# Patient Record
Sex: Female | Born: 1975
Health system: Southern US, Community
[De-identification: ages and names within clinical notes are randomized; demographics above are authoritative.]

## PROBLEM LIST (undated history)

## (undated) DIAGNOSIS — F32A Depression, unspecified: Secondary | ICD-10-CM

## (undated) DIAGNOSIS — Z973 Presence of spectacles and contact lenses: Secondary | ICD-10-CM

## (undated) DIAGNOSIS — N939 Abnormal uterine and vaginal bleeding, unspecified: Secondary | ICD-10-CM

## (undated) DIAGNOSIS — D649 Anemia, unspecified: Secondary | ICD-10-CM

## (undated) DIAGNOSIS — M069 Rheumatoid arthritis, unspecified: Secondary | ICD-10-CM

## (undated) DIAGNOSIS — F329 Major depressive disorder, single episode, unspecified: Secondary | ICD-10-CM

## (undated) DIAGNOSIS — I10 Essential (primary) hypertension: Secondary | ICD-10-CM

## (undated) DIAGNOSIS — IMO0001 Reserved for inherently not codable concepts without codable children: Secondary | ICD-10-CM

## (undated) DIAGNOSIS — M26609 Unspecified temporomandibular joint disorder, unspecified side: Secondary | ICD-10-CM

## (undated) HISTORY — DX: Depression, unspecified: F32.A

## (undated) HISTORY — DX: Abnormal uterine and vaginal bleeding, unspecified: N93.9

## (undated) HISTORY — DX: Essential (primary) hypertension: I10

## (undated) HISTORY — PX: ABDOMINAL HYSTERECTOMY: SHX81

## (undated) HISTORY — DX: Major depressive disorder, single episode, unspecified: F32.9

## (undated) HISTORY — DX: Rheumatoid arthritis, unspecified: M06.9

---

## 2010-12-27 ENCOUNTER — Other Ambulatory Visit: Payer: Self-pay | Admitting: Rheumatology

## 2010-12-27 DIAGNOSIS — M064 Inflammatory polyarthropathy: Secondary | ICD-10-CM

## 2010-12-28 ENCOUNTER — Ambulatory Visit
Admission: RE | Admit: 2010-12-28 | Discharge: 2010-12-28 | Disposition: A | Discharge status: Home / Self Care | Payer: BC Managed Care – PPO | Source: Ambulatory Visit | Attending: Rheumatology | Admitting: Rheumatology

## 2010-12-28 DIAGNOSIS — M064 Inflammatory polyarthropathy: Secondary | ICD-10-CM

## 2010-12-28 MED ORDER — GADOBENATE DIMEGLUMINE 529 MG/ML IV SOLN
18.0000 mL | Freq: Once | INTRAVENOUS | Status: AC | PRN
  Administered 2010-12-28: 18 mL via INTRAVENOUS

## 2011-03-08 DIAGNOSIS — M069 Rheumatoid arthritis, unspecified: Secondary | ICD-10-CM

## 2011-03-08 HISTORY — DX: Rheumatoid arthritis, unspecified: M06.9

## 2013-01-22 ENCOUNTER — Other Ambulatory Visit (HOSPITAL_COMMUNITY)
Admission: RE | Admit: 2013-01-22 | Discharge: 2013-01-22 | Disposition: A | Discharge status: Home / Self Care | Payer: BC Managed Care – PPO | Source: Ambulatory Visit | Attending: Family Medicine | Admitting: Family Medicine

## 2013-01-22 DIAGNOSIS — Z1151 Encounter for screening for human papillomavirus (HPV): Secondary | ICD-10-CM | POA: Insufficient documentation

## 2013-01-22 DIAGNOSIS — Z124 Encounter for screening for malignant neoplasm of cervix: Secondary | ICD-10-CM | POA: Insufficient documentation

## 2013-04-15 ENCOUNTER — Other Ambulatory Visit: Payer: Self-pay | Admitting: Family Medicine

## 2013-04-15 DIAGNOSIS — N939 Abnormal uterine and vaginal bleeding, unspecified: Secondary | ICD-10-CM

## 2014-07-30 ENCOUNTER — Other Ambulatory Visit: Payer: Self-pay | Admitting: Family Medicine

## 2014-07-30 ENCOUNTER — Ambulatory Visit
Admission: RE | Admit: 2014-07-30 | Discharge: 2014-07-30 | Disposition: A | Discharge status: Home / Self Care | Payer: 59 | Source: Ambulatory Visit | Attending: Family Medicine | Admitting: Family Medicine

## 2014-07-30 DIAGNOSIS — M545 Low back pain: Secondary | ICD-10-CM

## 2014-08-06 HISTORY — PX: OTHER SURGICAL HISTORY: SHX169

## 2015-07-01 ENCOUNTER — Encounter: Payer: Self-pay | Admitting: Obstetrics and Gynecology

## 2015-07-01 ENCOUNTER — Ambulatory Visit (HOSPITAL_COMMUNITY)
Admission: RE | Admit: 2015-07-01 | Discharge: 2015-07-01 | Disposition: A | Discharge status: Home / Self Care | Payer: Commercial Managed Care - HMO | Source: Ambulatory Visit | Attending: Obstetrics and Gynecology | Admitting: Obstetrics and Gynecology

## 2015-07-01 ENCOUNTER — Ambulatory Visit (INDEPENDENT_AMBULATORY_CARE_PROVIDER_SITE_OTHER): Payer: Commercial Managed Care - HMO | Admitting: Obstetrics and Gynecology

## 2015-07-01 ENCOUNTER — Telehealth: Payer: Self-pay

## 2015-07-01 VITALS — BP 142/98 | HR 90 | Resp 20 | Ht 67.0 in | Wt 206.8 lb

## 2015-07-01 DIAGNOSIS — D62 Acute posthemorrhagic anemia: Secondary | ICD-10-CM

## 2015-07-01 DIAGNOSIS — N939 Abnormal uterine and vaginal bleeding, unspecified: Secondary | ICD-10-CM

## 2015-07-01 DIAGNOSIS — R42 Dizziness and giddiness: Secondary | ICD-10-CM

## 2015-07-01 DIAGNOSIS — N920 Excessive and frequent menstruation with regular cycle: Secondary | ICD-10-CM | POA: Diagnosis present

## 2015-07-01 DIAGNOSIS — N8312 Corpus luteum cyst of left ovary: Secondary | ICD-10-CM | POA: Insufficient documentation

## 2015-07-01 LAB — CBC
HCT: 29.4 % — ABNORMAL LOW (ref 35.0–45.0)
Hemoglobin: 9.4 g/dL — ABNORMAL LOW (ref 11.7–15.5)
MCH: 27.7 pg (ref 27.0–33.0)
MCHC: 32 g/dL (ref 32.0–36.0)
MCV: 86.7 fL (ref 80.0–100.0)
MPV: 8.8 fL (ref 7.5–12.5)
Platelets: 398 10*3/uL (ref 140–400)
RBC: 3.39 MIL/uL — ABNORMAL LOW (ref 3.80–5.10)
RDW: 14.6 % (ref 11.0–15.0)
WBC: 5.1 10*3/uL (ref 3.8–10.8)

## 2015-07-01 LAB — POCT URINE PREGNANCY: Preg Test, Ur: NEGATIVE

## 2015-07-01 MED ORDER — MEDROXYPROGESTERONE ACETATE 10 MG PO TABS: 10.0000 mg | ORAL_TABLET | Freq: Every day | ORAL | Status: DC

## 2015-07-01 NOTE — Telephone Encounter (Signed)
Phone call to patient regarding her bleeding and pelvic ultrasound report.   Patient states her mother had uterine and ovarian cancer.  Patient questions this diagnosis.  She started the iron and the Provera.  Pelvic ultrasound - thickened endometrium of 20 mm. No obvious blood vessel.   No myometrial masses. Ovaries normal.  Left CL cyst.   I am recommending proceeding forward with a hysteroscopy with dilation and curettage.  Risks, benefits, and alternatives reviewed. Risks include but are not limited to bleeding, infection, damage to surrounding organs including uterine perforation requiring hospitalization and laparoscopy, pulmonary edema, reaction to anesthesia, DVT, PE, death, need for further treatment and surgery including hysterectomy or medical therapy.   Surgical expectations and recovery discussed.  Patient wishes to proceed. Will proceed with surgery on 07/03/15 at [facility].   Questions invited and answered.  Patient will not need to return for a preop visit tomorrow.

## 2015-07-01 NOTE — Telephone Encounter (Signed)
Ultrasound report review with Dr Quincy Simmonds. Call to patient. Advised Dr Quincy Simmonds has reviewed report and will be calling her personally.  Asked me to provide brief update until she is able to call. Advised ultrasound reveals thickened endometrial lining and sampling of lining is recommended. Dr Quincy Simmonds feels that best way to do this, particularly with amount of bleeding she is having, would be to proceed with D&C. Would like to proceed on Friday pending scheduling with hospital. Dr Quincy Simmonds will call her later this evening for further discussion. Patient agreeable.

## 2015-07-01 NOTE — Progress Notes (Signed)
Patient ID: Nicole Figueroa, female   DOB: 12-06-1975, 40 y.o.   MRN: [id number] GYNECOLOGY  VISIT   HPI: 40 y.o.   Married  Caucasian  female   [id number] with Patient's last menstrual period was 05/05/2015 (approximate).   here for irregular heavy menstrual bleeding since 05-05-15.  Clotting.  Passing 6 -7 per day.  Some lower back pain and headache.  Occasional tender moment in lower abdomen but usually no abdominal cramping.  Has low back pain with her cycles.  Feels out of breath.  Tired.  Works nights and has rheumatoid arthritis.   Menses prior to Feb. Were normal.  Normally lasting 3 -4 days and has only 2 heavy days.  Had a month long menses last year as well.   No prior ultrasound.   Referred by another patient.   Hgb 9.5.   UPT today:  neg  PCP - Dr. Kathyrn Lass  GYNECOLOGIC HISTORY: Patient's last menstrual period was 05/05/2015 (approximate). Contraception:  Vasectomy Menopausal hormone therapy:  n/a Last mammogram:  NEVER Last pap smear:   2 years ago normal per patient.  Had one repeat pap 10 - 12 years ago with repeat normal.         OB History    Gravida Para Term Preterm AB TAB SAB Ectopic Multiple Living   4 4 4       4          There are no active problems to display for this patient.   Past Medical History  Diagnosis Date  . Depression     when had a loss in family--fam hx of depression  . Abnormal uterine bleeding 2016, 06/2015  . Hypertension   . RA (rheumatoid arthritis) ([place]) 2013    --trying to control with diet--followed by PCP    Past Surgical History  Procedure Laterality Date  . Broken foot Right 08/2014    Current Outpatient Prescriptions  Medication Sig Dispense Refill  . FLUoxetine (PROZAC) 20 MG tablet Take 1 tablet by mouth daily.    . hydrochlorothiazide (HYDRODIURIL) 25 MG tablet Take 1 tablet by mouth daily.     No current facility-administered medications for this visit.     ALLERGIES: Review of patient's allergies  indicates no known allergies.  Family History  Problem Relation Age of Onset  . Heart disease Father     dec age 36  . Hypertension Mother   . Hypertension Maternal Grandmother   . Stroke Maternal Grandmother   . Cancer Maternal Aunt     dec brain ca--glioblastoma  . Cancer Maternal Grandfather     Dec Glioblastoma  . Cancer Other     Dec Glioblastoma    Social History   Social History  . Marital Status: Married    Spouse Name: N/A  . Number of Children: N/A  . Years of Education: N/A   Occupational History  . Not on file.   Social History Main Topics  . Smoking status: Former Smoker    Types: Cigarettes    Quit date: 03/07/2006  . Smokeless tobacco: Not on file  . Alcohol Use: 1.2 - 1.8 oz/week    2-3 Standard drinks or equivalent per week     Comment: 2-3 glasses of wine/week of occ. marguerita  . Drug Use: No  . Sexual Activity:    Partners: Male    Birth Control/ Protection: Other-see comments     Comment: husband with vasectomy   Other Topics Concern  . Not on file  Social History Narrative  . No narrative on file    ROS:  Pertinent items are noted in HPI.  PHYSICAL EXAMINATION:    BP 142/98 mmHg  Pulse 90  Resp 20  Ht 5' 7"  (1.702 m)  Wt 206 lb 12.8 oz (93.804 kg)  BMI 32.38 kg/m2  LMP 05/05/2015 (Approximate)    General appearance: alert, cooperative and appears stated age Head: Normocephalic, without obvious abnormality, atraumatic Neck: no adenopathy, supple, symmetrical, trachea midline and thyroid normal to inspection and palpation Lungs: clear to auscultation bilaterally Heart: regular rate and rhythm Abdomen: incision(s):No.,  soft, non-tender, no masses,  no organomegaly Extremities: extremities normal, atraumatic, no cyanosis or edema Skin: Skin color, texture, turgor normal. No rashes or lesions Lymph nodes: Cervical, supraclavicular, and axillary nodes normal. No abnormal inguinal nodes palpated Neurologic: Grossly  normal  Pelvic: External genitalia:  no lesions              Urethra:  normal appearing urethra with no masses, tenderness or lesions              Bartholins and Skenes: normal                 Vagina: normal appearing vagina with normal color and discharge, no lesions              Cervix: no lesions and clots in the vagina.           Bimanual Exam:  Uterus:  Slightly enlarged, nontender.               Adnexa: normal adnexa and no mass, fullness, tenderness          Chaperone was present for exam.  ASSESSMENT  Menometrorrhagia. 2 months duration.  Anemia.  Dizziness. HTN.  PLAN  Etiologies of abnormal bleeding discussed.  CBC, iron, ferritin, TSH, CMP. Proceed with pelvic ultrasound at [facility] today.  They will call report to our office. Rx for Provera 10 mg po q d for 10 days.  Start today.  Iron sulfate 325 mg po bid.  May need dilation and curettage. This discussed with patient.    An After Visit Summary was printed and given to the patient.  ___30___ minutes face to face time of which over 50% was spent in counseling.

## 2015-07-01 NOTE — Telephone Encounter (Signed)
Patient had u/s done today wanting to know what her next steps are? Best # to reach: [phone]

## 2015-07-01 NOTE — Patient Instructions (Signed)
Please take iron sulfate 325 mg by mouth twice a day for anemia.

## 2015-07-01 NOTE — Telephone Encounter (Signed)
Spoke with Caryl Pina from [facility] at time of incoming call. Impression report from PUS given by Caryl Pina as seen below. PUS result report is available in EPIC for review. Copy has been placed on Dr.Silva's desk for review and advise as well.  IMPRESSION: Endometrial complex measures 20 mm.  If bleeding remains unresponsive to hormonal or medical therapy, focal lesion work-up with sonohysterogram should be considered. Endometrial biopsy should also be considered in pre-menopausal patients at high risk for endometrial carcinoma.

## 2015-07-01 NOTE — Telephone Encounter (Signed)
Patient is asking to talk with Nicole Figueroa about the ultrasound that she had today.

## 2015-07-01 NOTE — Progress Notes (Signed)
Pelvic ultrasound scheduled for today at 3pm at [facility].

## 2015-07-02 ENCOUNTER — Encounter (HOSPITAL_BASED_OUTPATIENT_CLINIC_OR_DEPARTMENT_OTHER): Payer: Self-pay | Admitting: *Deleted

## 2015-07-02 ENCOUNTER — Encounter: Payer: Self-pay | Admitting: Obstetrics and Gynecology

## 2015-07-02 LAB — COMPREHENSIVE METABOLIC PANEL
ALT: 11 U/L (ref 6–29)
AST: 11 U/L (ref 10–30)
Albumin: 3.8 g/dL (ref 3.6–5.1)
Alkaline Phosphatase: 66 U/L (ref 33–115)
BUN: 7 mg/dL (ref 7–25)
CO2: 21 mmol/L (ref 20–31)
Calcium: 8.6 mg/dL (ref 8.6–10.2)
Chloride: 107 mmol/L (ref 98–110)
Creat: 0.79 mg/dL (ref 0.50–1.10)
Glucose, Bld: 100 mg/dL — ABNORMAL HIGH (ref 65–99)
Potassium: 4 mmol/L (ref 3.5–5.3)
Sodium: 141 mmol/L (ref 135–146)
Total Bilirubin: 0.3 mg/dL (ref 0.2–1.2)
Total Protein: 6.7 g/dL (ref 6.1–8.1)

## 2015-07-02 LAB — FERRITIN: Ferritin: 4 ng/mL — ABNORMAL LOW (ref 10–232)

## 2015-07-02 LAB — HEMOGLOBIN A1C
Hgb A1c MFr Bld: 5.2 % (ref <5.7)
Mean Plasma Glucose: 103 mg/dL

## 2015-07-02 LAB — TSH: TSH: 1.99 mIU/L

## 2015-07-02 LAB — IRON: Iron: 12 ug/dL — ABNORMAL LOW (ref 40–190)

## 2015-07-02 NOTE — Telephone Encounter (Signed)
I would recommend waiting until Monday for work due to the intensity and demands of her work at [employer].

## 2015-07-02 NOTE — Telephone Encounter (Signed)
Call to patient. Notified of Dr Elza Rafter recommendation. Encounter closed.

## 2015-07-02 NOTE — Anesthesia Preprocedure Evaluation (Addendum)
Anesthesia Evaluation  Patient identified by MRN, date of birth, ID band Patient awake    Reviewed: Allergy & Precautions, NPO status , Patient's Chart, lab work & pertinent test results  History of Anesthesia Complications Negative for: history of anesthetic complications  Airway Mallampati: II  TM Distance: >3 FB Neck ROM: Full    Dental no notable dental hx. (+) Dental Advisory Given, Teeth Intact   Pulmonary neg pulmonary ROS, former smoker,    Pulmonary exam normal breath sounds clear to auscultation       Cardiovascular hypertension, Pt. on medications Normal cardiovascular exam Rhythm:Regular Rate:Normal     Neuro/Psych PSYCHIATRIC DISORDERS Anxiety Depression negative neurological ROS     GI/Hepatic negative GI ROS, Neg liver ROS,   Endo/Other  obesity  Renal/GU negative Renal ROS  negative genitourinary   Musculoskeletal  (+) Arthritis ,   Abdominal   Peds negative pediatric ROS (+)  Hematology negative hematology ROS (+) anemia ,   Anesthesia Other Findings   Reproductive/Obstetrics negative OB ROS                         Anesthesia Physical Anesthesia Plan  ASA: II  Anesthesia Plan: General   Post-op Pain Management:    Induction: Intravenous  Airway Management Planned: LMA  Additional Equipment:   Intra-op Plan:   Post-operative Plan: Extubation in OR  Informed Consent: I have reviewed the patients History and Physical, chart, labs and discussed the procedure including the risks, benefits and alternatives for the proposed anesthesia with the patient or authorized representative who has indicated his/her understanding and acceptance.   Dental advisory given  Plan Discussed with: CRNA  Anesthesia Plan Comments:         Anesthesia Quick Evaluation

## 2015-07-02 NOTE — Progress Notes (Signed)
Pt instructed npo pmn tonight.  To [facility] 4/28 @ 0600.  Labs in epic.

## 2015-07-02 NOTE — Telephone Encounter (Signed)
Surgery scheduled for Friday 07-03-15 at [facility] at 0730. Call to patient, advised of surgery date and time. Surgery instructions reviewed with patient and directions given to facility.  Advised surgery center will contact her as well. Plan to arrive at 0600.  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  Advised will review with Dr Quincy Simmonds for recommendation on returning on Sunday night.

## 2015-07-03 ENCOUNTER — Encounter (HOSPITAL_BASED_OUTPATIENT_CLINIC_OR_DEPARTMENT_OTHER)
Admission: RE | Disposition: A | Discharge status: Home / Self Care | Payer: Self-pay | Source: Ambulatory Visit | Attending: Obstetrics and Gynecology

## 2015-07-03 ENCOUNTER — Ambulatory Visit (HOSPITAL_BASED_OUTPATIENT_CLINIC_OR_DEPARTMENT_OTHER): Payer: Commercial Managed Care - HMO | Admitting: Anesthesiology

## 2015-07-03 ENCOUNTER — Ambulatory Visit (HOSPITAL_COMMUNITY)
Admission: RE | Admit: 2015-07-03 | Discharge: 2015-07-03 | Disposition: A | Discharge status: Home / Self Care | Payer: Commercial Managed Care - HMO | Source: Ambulatory Visit | Attending: Obstetrics and Gynecology | Admitting: Obstetrics and Gynecology

## 2015-07-03 ENCOUNTER — Encounter (HOSPITAL_BASED_OUTPATIENT_CLINIC_OR_DEPARTMENT_OTHER): Payer: Self-pay | Admitting: Anesthesiology

## 2015-07-03 DIAGNOSIS — M545 Low back pain: Secondary | ICD-10-CM | POA: Insufficient documentation

## 2015-07-03 DIAGNOSIS — D649 Anemia, unspecified: Secondary | ICD-10-CM | POA: Diagnosis not present

## 2015-07-03 DIAGNOSIS — I10 Essential (primary) hypertension: Secondary | ICD-10-CM | POA: Diagnosis not present

## 2015-07-03 DIAGNOSIS — N840 Polyp of corpus uteri: Secondary | ICD-10-CM | POA: Diagnosis not present

## 2015-07-03 DIAGNOSIS — R51 Headache: Secondary | ICD-10-CM | POA: Insufficient documentation

## 2015-07-03 DIAGNOSIS — N8500 Endometrial hyperplasia, unspecified: Secondary | ICD-10-CM | POA: Diagnosis not present

## 2015-07-03 DIAGNOSIS — M069 Rheumatoid arthritis, unspecified: Secondary | ICD-10-CM | POA: Diagnosis not present

## 2015-07-03 DIAGNOSIS — N921 Excessive and frequent menstruation with irregular cycle: Secondary | ICD-10-CM | POA: Insufficient documentation

## 2015-07-03 DIAGNOSIS — F329 Major depressive disorder, single episode, unspecified: Secondary | ICD-10-CM | POA: Insufficient documentation

## 2015-07-03 DIAGNOSIS — F419 Anxiety disorder, unspecified: Secondary | ICD-10-CM | POA: Diagnosis not present

## 2015-07-03 DIAGNOSIS — N399 Disorder of urinary system, unspecified: Secondary | ICD-10-CM | POA: Diagnosis not present

## 2015-07-03 DIAGNOSIS — Z87891 Personal history of nicotine dependence: Secondary | ICD-10-CM | POA: Insufficient documentation

## 2015-07-03 HISTORY — DX: Unspecified temporomandibular joint disorder, unspecified side: M26.609

## 2015-07-03 HISTORY — PX: HYSTEROSCOPY WITH D & C: SHX1775

## 2015-07-03 HISTORY — DX: Anemia, unspecified: D64.9

## 2015-07-03 HISTORY — DX: Presence of spectacles and contact lenses: Z97.3

## 2015-07-03 HISTORY — DX: Reserved for inherently not codable concepts without codable children: IMO0001

## 2015-07-03 SURGERY — DILATATION AND CURETTAGE /HYSTEROSCOPY
Anesthesia: General

## 2015-07-03 MED ORDER — PHENYLEPHRINE HCL 10 MG/ML IJ SOLN
INTRAMUSCULAR | Status: DC | PRN
  Administered 2015-07-03 (×2): 80 ug via INTRAVENOUS
  Administered 2015-07-03: 40 ug via INTRAVENOUS

## 2015-07-03 MED ORDER — LIDOCAINE HCL (CARDIAC) 20 MG/ML IV SOLN
INTRAVENOUS | Status: DC | PRN
  Administered 2015-07-03: 100 mg via INTRAVENOUS

## 2015-07-03 MED ORDER — CEFAZOLIN SODIUM-DEXTROSE 2-4 GM/100ML-% IV SOLN
2.0000 g | INTRAVENOUS | Status: DC
  Filled 2015-07-03: qty 100

## 2015-07-03 MED ORDER — PROPOFOL 10 MG/ML IV BOLUS
INTRAVENOUS | Status: AC
  Filled 2015-07-03: qty 40

## 2015-07-03 MED ORDER — LABETALOL HCL 5 MG/ML IV SOLN
INTRAVENOUS | Status: AC
  Filled 2015-07-03: qty 4

## 2015-07-03 MED ORDER — DEXAMETHASONE SODIUM PHOSPHATE 10 MG/ML IJ SOLN
INTRAMUSCULAR | Status: AC
  Filled 2015-07-03: qty 1

## 2015-07-03 MED ORDER — CEFAZOLIN SODIUM-DEXTROSE 2-4 GM/100ML-% IV SOLN
INTRAVENOUS | Status: AC
  Filled 2015-07-03: qty 100

## 2015-07-03 MED ORDER — PROPOFOL 10 MG/ML IV BOLUS
INTRAVENOUS | Status: DC | PRN
  Administered 2015-07-03: 200 mg via INTRAVENOUS

## 2015-07-03 MED ORDER — FENTANYL CITRATE (PF) 100 MCG/2ML IJ SOLN
INTRAMUSCULAR | Status: AC
  Filled 2015-07-03: qty 2

## 2015-07-03 MED ORDER — ONDANSETRON HCL 4 MG/2ML IJ SOLN
INTRAMUSCULAR | Status: AC
  Filled 2015-07-03: qty 2

## 2015-07-03 MED ORDER — PHENYLEPHRINE 40 MCG/ML (10ML) SYRINGE FOR IV PUSH (FOR BLOOD PRESSURE SUPPORT)
PREFILLED_SYRINGE | INTRAVENOUS | Status: AC
  Filled 2015-07-03: qty 10

## 2015-07-03 MED ORDER — KETOROLAC TROMETHAMINE 30 MG/ML IJ SOLN
INTRAMUSCULAR | Status: DC | PRN
  Administered 2015-07-03: 30 mg via INTRAVENOUS

## 2015-07-03 MED ORDER — ONDANSETRON HCL 4 MG/2ML IJ SOLN
4.0000 mg | Freq: Once | INTRAMUSCULAR | Status: DC | PRN
  Filled 2015-07-03: qty 2

## 2015-07-03 MED ORDER — LACTATED RINGERS IV SOLN
INTRAVENOUS | Status: DC
  Administered 2015-07-03 (×2): via INTRAVENOUS
  Filled 2015-07-03: qty 1000

## 2015-07-03 MED ORDER — FENTANYL CITRATE (PF) 100 MCG/2ML IJ SOLN
INTRAMUSCULAR | Status: DC | PRN
  Administered 2015-07-03 (×2): 50 ug via INTRAVENOUS

## 2015-07-03 MED ORDER — MIDAZOLAM HCL 5 MG/5ML IJ SOLN
INTRAMUSCULAR | Status: DC | PRN
  Administered 2015-07-03: 2 mg via INTRAVENOUS

## 2015-07-03 MED ORDER — FENTANYL CITRATE (PF) 100 MCG/2ML IJ SOLN
25.0000 ug | INTRAMUSCULAR | Status: DC | PRN
  Filled 2015-07-03: qty 1

## 2015-07-03 MED ORDER — DEXAMETHASONE SODIUM PHOSPHATE 4 MG/ML IJ SOLN
INTRAMUSCULAR | Status: DC | PRN
  Administered 2015-07-03: 10 mg via INTRAVENOUS

## 2015-07-03 MED ORDER — ONDANSETRON HCL 4 MG/2ML IJ SOLN
INTRAMUSCULAR | Status: DC | PRN
  Administered 2015-07-03: 4 mg via INTRAVENOUS

## 2015-07-03 MED ORDER — IBUPROFEN 800 MG PO TABS: 800.0000 mg | ORAL_TABLET | Freq: Three times a day (TID) | ORAL | Status: DC | PRN

## 2015-07-03 MED ORDER — LACTATED RINGERS IV SOLN
INTRAVENOUS | Status: DC
  Filled 2015-07-03: qty 1000

## 2015-07-03 MED ORDER — KETOROLAC TROMETHAMINE 30 MG/ML IJ SOLN
INTRAMUSCULAR | Status: AC
  Filled 2015-07-03: qty 1

## 2015-07-03 MED ORDER — LABETALOL HCL 5 MG/ML IV SOLN
5.0000 mg | INTRAVENOUS | Status: DC | PRN
  Administered 2015-07-03: 5 mg via INTRAVENOUS
  Filled 2015-07-03: qty 4

## 2015-07-03 MED ORDER — MIDAZOLAM HCL 2 MG/2ML IJ SOLN
INTRAMUSCULAR | Status: AC
  Filled 2015-07-03: qty 2

## 2015-07-03 MED ORDER — LIDOCAINE HCL 1 % IJ SOLN
INTRAMUSCULAR | Status: DC | PRN
  Administered 2015-07-03: 10 mL

## 2015-07-03 SURGICAL SUPPLY — 24 items
CANISTER SUCTION 2500CC (MISCELLANEOUS) ×2 IMPLANT
CATH ROBINSON RED A/P 16FR (CATHETERS) ×2 IMPLANT
COVER BACK TABLE 60X90IN (DRAPES) ×2 IMPLANT
COVER MAYO STAND STRL (DRAPES) ×2 IMPLANT
DRAPE HYSTEROSCOPY (DRAPE) ×2 IMPLANT
DRAPE LG THREE QUARTER DISP (DRAPES) ×2 IMPLANT
GLOVE BIO SURGEON STRL SZ 6.5 (GLOVE) ×2 IMPLANT
GLOVE BIOGEL PI IND STRL 7.0 (GLOVE) ×2 IMPLANT
GLOVE BIOGEL PI INDICATOR 7.0 (GLOVE) ×2
GOWN STRL REUS W/ TWL LRG LVL3 (GOWN DISPOSABLE) ×1 IMPLANT
GOWN STRL REUS W/ TWL XL LVL3 (GOWN DISPOSABLE) ×1 IMPLANT
GOWN STRL REUS W/TWL LRG LVL3 (GOWN DISPOSABLE) ×1
GOWN STRL REUS W/TWL XL LVL3 (GOWN DISPOSABLE) ×1
KIT ROOM TURNOVER WOR (KITS) ×2 IMPLANT
LEGGING LITHOTOMY PAIR STRL (DRAPES) ×2 IMPLANT
LOOP ANGLED CUTTING 22FR (CUTTING LOOP) IMPLANT
PACK BASIN DAY SURGERY FS (CUSTOM PROCEDURE TRAY) ×2 IMPLANT
PAD OB MATERNITY 4.3X12.25 (PERSONAL CARE ITEMS) ×2 IMPLANT
PAD PREP 24X48 CUFFED NSTRL (MISCELLANEOUS) ×2 IMPLANT
TOWEL OR 17X24 6PK STRL BLUE (TOWEL DISPOSABLE) ×4 IMPLANT
TRAY DSU PREP LF (CUSTOM PROCEDURE TRAY) ×2 IMPLANT
TUBING AQUILEX INFLOW (TUBING) ×2 IMPLANT
TUBING AQUILEX OUTFLOW (TUBING) ×2 IMPLANT
WATER STERILE IRR 500ML POUR (IV SOLUTION) ×2 IMPLANT

## 2015-07-03 NOTE — Anesthesia Procedure Notes (Signed)
Procedure Name: LMA Insertion Date/Time: 07/03/2015 7:28 AM Performed by: Wanita Chamberlain Pre-anesthesia Checklist: Patient identified, Timeout performed, Emergency Drugs available, Suction available and Patient being monitored Patient Re-evaluated:Patient Re-evaluated prior to inductionOxygen Delivery Method: Circle system utilized Preoxygenation: Pre-oxygenation with 100% oxygen Intubation Type: IV induction Ventilation: Mask ventilation without difficulty LMA: LMA inserted LMA Size: 4.0 Number of attempts: 1 Placement Confirmation: positive ETCO2 and breath sounds checked- equal and bilateral Tube secured with: Tape Dental Injury: Teeth and Oropharynx as per pre-operative assessment

## 2015-07-03 NOTE — Brief Op Note (Signed)
07/03/2015  7:51 AM  PATIENT:  Nicole Figueroa  40 y.o. female  PRE-OPERATIVE DIAGNOSIS:  AUB, thickened endometrium, anemia  POST-OPERATIVE DIAGNOSIS:  AUB, thickened endometrium, anemia  PROCEDURE:  Procedure(s): DILATATION AND CURETTAGE /HYSTEROSCOPY (N/A)  SURGEON:  Surgeon(s) and Role:    * Brook E Yisroel Ramming, MD - Primary  PHYSICIAN ASSISTANT: NA  ASSISTANTS: none   ANESTHESIA:   paracervical block and LMA  EBL:  Total I/O In: 600 [I.V.:600] Out: 40 [Urine:25; Blood:15]  BLOOD ADMINISTERED:none  DRAINS: none   LOCAL MEDICATIONS USED:  LIDOCAINE   SPECIMEN:  Source of Specimen:  endometrial curettings  DISPOSITION OF SPECIMEN:  PATHOLOGY  COUNTS:  YES  TOURNIQUET:  * No tourniquets in log *  DICTATION: .Note written in EPIC  PLAN OF CARE: Discharge to home after PACU  PATIENT DISPOSITION:  PACU - hemodynamically stable.   Delay start of Pharmacological VTE agent (>24hrs) due to surgical blood loss or risk of bleeding: not applicable

## 2015-07-03 NOTE — Anesthesia Postprocedure Evaluation (Signed)
Anesthesia Post Note  Patient: Nicole Figueroa  Procedure(s) Performed: Procedure(s) (LRB): DILATATION AND CURETTAGE /HYSTEROSCOPY (N/A)  Patient location during evaluation: PACU Anesthesia Type: General Level of consciousness: awake and alert Pain management: pain level controlled Vital Signs Assessment: post-procedure vital signs reviewed and stable Respiratory status: spontaneous breathing, nonlabored ventilation, respiratory function stable and patient connected to nasal cannula oxygen Cardiovascular status: blood pressure returned to baseline and stable Postop Assessment: no signs of nausea or vomiting Anesthetic complications: no    Last Vitals:  Filed Vitals:   07/03/15 0822 07/03/15 0830  BP:  135/95  Pulse: 73 87  Temp:    Resp: 11 18    Last Pain:  Filed Vitals:   07/03/15 0833  PainSc: 4                  JUDD, MARY JENNETTE

## 2015-07-03 NOTE — H&P (Signed)
Nunzio Cobbs, MD at 07/01/2015 9:27 AM     Status: Signed       Expand All Collapse All   Patient ID: Nicole Figueroa, female DOB: Nov 06, 1975, 40 y.o. MRN: [id number] GYNECOLOGY VISIT  HPI: 40 y.o. Married Caucasian female  [id number] with Patient's last menstrual period was 05/05/2015 (approximate).  here for irregular heavy menstrual bleeding since 05-05-15.  Clotting. Passing 6 -7 per day.  Some lower back pain and headache.  Occasional tender moment in lower abdomen but usually no abdominal cramping.  Has low back pain with her cycles.  Feels out of breath.  Tired. Works nights and has rheumatoid arthritis.   Menses prior to Feb. Were normal.  Normally lasting 3 -4 days and has only 2 heavy days.  Had a month long menses last year as well.   No prior ultrasound.   Referred by another patient.   Hgb 9.5.   UPT today: neg  PCP - Dr. Kathyrn Lass  GYNECOLOGIC HISTORY: Patient's last menstrual period was 05/05/2015 (approximate). Contraception: Vasectomy Menopausal hormone therapy: n/a Last mammogram: NEVER Last pap smear: 2 years ago normal per patient. Had one repeat pap 10 - 12 years ago with repeat normal.    OB History    Gravida Para Term Preterm AB TAB SAB Ectopic Multiple Living   4 4 4       4        There are no active problems to display for this patient.   Past Medical History  Diagnosis Date  . Depression     when had a loss in family--fam hx of depression  . Abnormal uterine bleeding 2016, 06/2015  . Hypertension   . RA (rheumatoid arthritis) ([place]) 2013    --trying to control with diet--followed by PCP    Past Surgical History  Procedure Laterality Date  . Broken foot Right 08/2014    Current Outpatient Prescriptions  Medication Sig Dispense Refill  . FLUoxetine (PROZAC) 20 MG tablet Take 1 tablet by mouth daily.    .  hydrochlorothiazide (HYDRODIURIL) 25 MG tablet Take 1 tablet by mouth daily.     No current facility-administered medications for this visit.     ALLERGIES: Review of patient's allergies indicates no known allergies.  Family History  Problem Relation Age of Onset  . Heart disease Father     dec age 22  . Hypertension Mother   . Hypertension Maternal Grandmother   . Stroke Maternal Grandmother   . Cancer Maternal Aunt     dec brain ca--glioblastoma  . Cancer Maternal Grandfather     Dec Glioblastoma  . Cancer Other     Dec Glioblastoma    Social History   Social History  . Marital Status: Married    Spouse Name: N/A  . Number of Children: N/A  . Years of Education: N/A   Occupational History  . Not on file.   Social History Main Topics  . Smoking status: Former Smoker    Types: Cigarettes    Quit date: 03/07/2006  . Smokeless tobacco: Not on file  . Alcohol Use: 1.2 - 1.8 oz/week    2-3 Standard drinks or equivalent per week     Comment: 2-3 glasses of wine/week of occ. marguerita  . Drug Use: No  . Sexual Activity:    Partners: Male    Birth Control/ Protection: Other-see comments     Comment: husband with vasectomy   Other Topics Concern  . Not on file  Social History Narrative  . No narrative on file    ROS: Pertinent items are noted in HPI.  PHYSICAL EXAMINATION:   BP 142/98 mmHg  Pulse 90  Resp 20  Ht 5' 7"  (1.702 m)  Wt 206 lb 12.8 oz (93.804 kg)  BMI 32.38 kg/m2  LMP 05/05/2015 (Approximate)  General appearance: alert, cooperative and appears stated age Head: Normocephalic, without obvious abnormality, atraumatic Neck: no adenopathy, supple, symmetrical, trachea midline and thyroid normal to inspection and palpation Lungs: clear to auscultation bilaterally Heart: regular rate and rhythm Abdomen:  incision(s):No., soft, non-tender, no masses, no organomegaly Extremities: extremities normal, atraumatic, no cyanosis or edema Skin: Skin color, texture, turgor normal. No rashes or lesions Lymph nodes: Cervical, supraclavicular, and axillary nodes normal. No abnormal inguinal nodes palpated Neurologic: Grossly normal  Pelvic: External genitalia: no lesions  Urethra: normal appearing urethra with no masses, tenderness or lesions  Bartholins and Skenes: normal   Vagina: normal appearing vagina with normal color and discharge, no lesions  Cervix: no lesions and clots in the vagina.    Bimanual Exam: Uterus: Slightly enlarged, nontender.   Adnexa: normal adnexa and no mass, fullness, tenderness   Chaperone was present for exam.  ASSESSMENT  Menometrorrhagia. 2 months duration.  Anemia.  Dizziness. HTN.  PLAN  Etiologies of abnormal bleeding discussed.  CBC, iron, ferritin, TSH, CMP. Proceed with pelvic ultrasound at [facility] today. They will call report to our office. Rx for Provera 10 mg po q d for 10 days. Start today.  Iron sulfate 325 mg po bid.  May need dilation and curettage. This discussed with patient.   An After Visit Summary was printed and given to the patient.  ___30___ minutes face to face time of which over 50% was spent in counseling.            Revision History       Date/Time User Action    > 07/02/2015 7:47 PM Nunzio Cobbs, MD Sign     07/01/2015 9:28 AM Lowella Fairy, CMA Sign at close encounter              Huey Romans, RN at 07/01/2015 10:23 AM     Status: Signed       Expand All Collapse All   Pelvic ultrasound scheduled for today at 3pm at [facility]

## 2015-07-03 NOTE — Op Note (Signed)
OPERATIVE REPORT   PREOPERATIVE DIAGNOSES:   Menorrhagia with irregular menses, thickened endometrium, anemia.  POSTOPERATIVE DIAGNOSES:   Menorrhagia with irregular menses, thickened endometrium, anemia.  PROCEDURE:  Hysteroscopy with dilation and curettage.  SURGEON:  Lenard Galloway, MD  ANESTHESIA:  LMA, paracervical block with 10 mL of 1% lidocaine.  IV FLUIDS:   700 cc LR  EBL:  15 cc.   URINE OUTPUT: 25 cc.  NORMAL SALINE DEFICIT:   75 cc.  COMPLICATIONS:  None.  INDICATIONS FOR THE PROCEDURE:   The patient is a 40 year old G67P4 Caucasian female who presents with prolonged and heavy vaginal bleeding and clotting. Pelvic ultrasound demonstrated an endometrial stripe of 2 cm, but an otherwise normal uterus and ovaries.  Her hemoglobin measured 9.4.  A plan is now made to proceed with a hysteroscopy with dilation and curettage after risks, benefits and alternatives were reviewed.  FINDINGS:  Exam under anesthesia revealed a small anteverted uterus.  No adnexal masses were appreciated. The uterus was sounded to 8.5 cm.  Hysteroscopy showed no intracavitary lesions.  The tubal ostia were normal.  Endometrial currettings were a large amount.   SPECIMENS:  Endometrial curettings were sent to Pathology.  PROCEDURE IN DETAIL:  The patient was reidentified in the preoperative hold area.  She did receive Ancef IV for antibiotic prophylaxis and she received PAS stockings for DVT prophylaxis.  In the operating room, the patient was placed in the dorsal lithotomy position and then an LMA anesthetic was introduced.  The patient's lower abdomen, vagina and perineum were sterilely prepped with Betadine and the  patient's bladder was catheterized of urine.  She was sterilly draped.  An exam under anesthesia was performed.  A speculum was placed inside the vagina and a single-tooth tenaculum was placed on the anterior cervical lip.  A paracervical block  was performed with a total of 10 mL of 1% lidocaine plain.  The uterus was sounded. The cervix was dilated to a #19 Pratt dilator.  The diagnostic hysteroscope was then inserted inside the uterine cavity under the continuous infusion of normal saline solution.  Findings are as noted above.  The  hysteroscope was removed.  The sharp and then a serrated curette were  introduced into the uterine cavity and the endometrium was curetted in all 4 quadrants.  A large amount of endometrial curettings was obtained.  This specimen was sent to Pathology.  The single-tooth tenaculum which had been placed on the anterior cervical lip was removed.  Hemostasis was good, and all of the vaginal  instruments were removed.  The patient was awakened and escorted to the recovery room in stable condition after she was cleansed with Betadine.  There were no complications to the procedure.  All needle, instrument and sponge counts were correct.  Lenard Galloway, MD

## 2015-07-03 NOTE — Discharge Instructions (Signed)
Hysteroscopy, Care After Refer to this sheet in the next few weeks. These instructions provide you with information on caring for yourself after your procedure. Your health care provider may also give you more specific instructions. Your treatment has been planned according to current medical practices, but problems sometimes occur. Call your health care provider if you have any problems or questions after your procedure.  WHAT TO EXPECT AFTER THE PROCEDURE After your procedure, it is typical to have the following:  You may have some cramping. This normally lasts for a couple days.  You may have bleeding. This can vary from light spotting for a few days to menstrual-like bleeding for 3-7 days. HOME CARE INSTRUCTIONS  Rest for the first 1-2 days after the procedure.  Only take over-the-counter or prescription medicines as directed by your health care provider. Do not take aspirin. It can increase the chances of bleeding.  Take showers instead of baths for 2 weeks or as directed by your health care provider.  Do not drive for 24 hours or as directed.  Do not drink alcohol while taking pain medicine.  Do not use tampons, douche, or have sexual intercourse for 2 weeks or until your health care provider says it is okay.  Take your temperature twice a day for 4-5 days. Write it down each time.  Follow your health care provider's advice about diet, exercise, and lifting.  If you develop constipation, you may:  Take a mild laxative if your health care provider approves.  Add bran foods to your diet.  Drink enough fluids to keep your urine clear or pale yellow.  Try to have someone with you or available to you for the first 24-48 hours, especially if you were given a general anesthetic.  Follow up with your health care provider as directed. SEEK MEDICAL CARE IF:  You feel dizzy or lightheaded.  You feel sick to your stomach (nauseous).  You have abnormal vaginal discharge.  You  have a rash.  You have pain that is not controlled with medicine. SEEK IMMEDIATE MEDICAL CARE IF:  You have bleeding that is heavier than a normal menstrual period.  You have a fever.  You have increasing cramps or pain, not controlled with medicine.  You have new belly (abdominal) pain.  You pass out.  You have pain in the tops of your shoulders (shoulder strap areas).  You have shortness of breath.   This information is not intended to replace advice given to you by your health care provider. Make sure you discuss any questions you have with your health care provider.   Document Released: 12/12/2012 Document Reviewed: 12/12/2012 Elsevier Interactive Patient Education 2016 Elsevier Inc[employer]  Dilation and Curettage or Vacuum Curettage, Care After Refer to this sheet in the next few weeks. These instructions provide you with information on caring for yourself after your procedure. Your health care provider may also give you more specific instructions. Your treatment has been planned according to current medical practices, but problems sometimes occur. Call your health care provider if you have any problems or questions after your procedure. WHAT TO EXPECT AFTER THE PROCEDURE After your procedure, it is typical to have light cramping and bleeding. This may last for 2 days to 2 weeks after the procedure. HOME CARE INSTRUCTIONS   Do not drive for 24 hours.  Wait 1 week before returning to strenuous activities.  Take your temperature 2 times a day for 4 days and write it down. Provide these temperatures to  your health care provider if you develop a fever.  Avoid long periods of standing.  Avoid heavy lifting, pushing, or pulling. Do not lift anything heavier than 10 pounds (4.5 kg).  Limit stair climbing to once or twice a day.  Take rest periods often.  You may resume your usual diet.  Drink enough fluids to keep your urine clear or pale yellow.  Your usual bowel function  should return. If you have constipation, you may:  Take a mild laxative with permission from your health care provider.  Add fruit and bran to your diet.  Drink more fluids.  Take showers instead of baths until your health care provider gives you permission to take baths.  Do not go swimming or use a hot tub until your health care provider approves.  Try to have someone with you or available to you the first 24-48 hours, especially if you were given a general anesthetic.  Do not douche, use tampons, or have sex (intercourse) for 2 weeks after the procedure.  Only take over-the-counter or prescription medicines as directed by your health care provider. Do not take aspirin. It can cause bleeding.  Follow up with your health care provider as directed. SEEK MEDICAL CARE IF:   You have increasing cramps or pain that is not relieved with medicine.  You have abdominal pain that does not seem to be related to the same area of earlier cramping and pain.  You have bad smelling vaginal discharge.  You have a rash.  You are having problems with any medicine. SEEK IMMEDIATE MEDICAL CARE IF:   You have bleeding that is heavier than a normal menstrual period.  You have a fever.  You have chest pain.  You have shortness of breath.  You feel dizzy or feel like fainting.  You pass out.  You have pain in your shoulder strap area.  You have heavy vaginal bleeding with or without blood clots. MAKE SURE YOU:   Understand these instructions.  Will watch your condition.  Will get help right away if you are not doing well or get worse.   This information is not intended to replace advice given to you by your health care provider. Make sure you discuss any questions you have with your health care provider.   Document Released: 02/19/2000 Document Revised: 02/26/2013 Document Reviewed: 09/20/2012 Elsevier Interactive Patient Education 2016 [facility]  Care Instructions  Activity: Get plenty of rest for the remainder of the day. A responsible adult should stay with you for 24 hours following the procedure.  For the next 24 hours, DO NOT: -Drive a car -Paediatric nurse -Drink alcoholic beverages -Take any medication unless instructed by your physician -Make any legal decisions or sign important papers.  Meals: Start with liquid foods such as gelatin or soup. Progress to regular foods as tolerated. Avoid greasy, spicy, heavy foods. If nausea and/or vomiting occur, drink only clear liquids until the nausea and/or vomiting subsides. Call your physician if vomiting continues.  Special Instructions/Symptoms: Your throat may feel dry or sore from the anesthesia or the breathing tube placed in your throat during surgery. If this causes discomfort, gargle with warm salt water. The discomfort should disappear within 24 hours.  If you had a scopolamine patch placed behind your ear for the management of post- operative nausea and/or vomiting:  1. The medication in the patch is effective for 72 hours, after which it should be removed.  Wrap patch in  a tissue and discard in the trash. Wash hands thoroughly with soap and water. 2. You may remove the patch earlier than 72 hours if you experience unpleasant side effects which may include dry mouth, dizziness or visual disturbances. 3. Avoid touching the patch. Wash your hands with soap and water after contact with the patch.

## 2015-07-03 NOTE — Transfer of Care (Signed)
Immediate Anesthesia Transfer of Care Note  Patient: Nicole Figueroa  Procedure(s) Performed: Procedure(s): DILATATION AND CURETTAGE /HYSTEROSCOPY (N/A)  Patient Location: PACU  Anesthesia Type:General  Level of Consciousness: awake, alert , oriented and patient cooperative  Airway & Oxygen Therapy: Patient Spontanous Breathing and Patient connected to nasal cannula oxygen  Post-op Assessment: Report given to RN and Post -op Vital signs reviewed and stable  Post vital signs: Reviewed and stable  Last Vitals:  Filed Vitals:   07/03/15 0620  BP: 148/101  Pulse: 97  Temp: 37.3 C  Resp: 16    Last Pain:  Filed Vitals:   07/03/15 0626  PainSc: 3       Patients Stated Pain Goal: 5 (26/33/35 4562)  Complications: No apparent anesthesia complications

## 2015-07-03 NOTE — Progress Notes (Signed)
Update to History and Physical  Ultrasound showing endometrial thickening with EMS 20 mm.  Bleeding slowing some with Provera.   Patient examined.   Ok to proceed with hysteroscopy with dilation and curettage.  Risks, benefits, and alternatives reviewed with the patient who wishes to proceed.

## 2015-07-06 ENCOUNTER — Encounter (HOSPITAL_BASED_OUTPATIENT_CLINIC_OR_DEPARTMENT_OTHER): Payer: Self-pay | Admitting: Obstetrics and Gynecology

## 2015-07-06 ENCOUNTER — Encounter: Payer: Self-pay | Admitting: Obstetrics and Gynecology

## 2015-07-06 ENCOUNTER — Telehealth: Payer: Self-pay

## 2015-07-06 NOTE — Telephone Encounter (Signed)
Non-Urgent Medical Question  Message [id number]   From  Shaleena Crusoe   To  Nunzio Cobbs, MD   Sent  07/06/2015 4:02 PM     Hey Dr Quincy Simmonds, I am doing well and   heading back to work tonight. I am noticing that I am still very tired amd very short of breath. Is that normal? Is it because of the low iron? How long will it take to get my iron back to where it needs to be?   Thanks  Nicole Figueroa      Responsible Party    Pool - [facility] No one has taken responsibility for this message.     No actions have been taken on this message.     Routing to [place] for review and advise.

## 2015-07-06 NOTE — Telephone Encounter (Signed)
Please report to patient her surgical pathology showing endometrial polyp.  There was no macroscopic polyp, so this is a microscopic diagnosis only.  Please have patient see me this week for her postop appointment and blood work so I can recheck a CBC.

## 2015-07-06 NOTE — Telephone Encounter (Signed)
Telephone encounter created to discuss mychart message with Dr.Silva.

## 2015-07-07 NOTE — Telephone Encounter (Signed)
Spoke with patient's husband Gaspar Bidding, okay per ROI. Gaspar Bidding states he is calling to check on mychart message as the patient worked last night and was unsure if we would be able to reach her since she is sleeping. Advised of message as seen below from [place]. Gaspar Bidding states that his wife feels slightly short of breath due to fatigue. Denies dizziness, light headedness, or chest pain. Gaspar Bidding will check with the patient to ensure no new symptoms have developed. Appointment for recheck scheduled for tomorrow 07/08/2015 at 10 am. Advised if the patient develops any new symptoms such as increased SOB, chest pain, dizziness, or light headedness she will need to be seen in the office earlier. He is agreeable.  Routing to provider for final review. Patient agreeable to disposition. Will close encounter.

## 2015-07-07 NOTE — Telephone Encounter (Signed)
Patient's husband Gaspar Bidding (dpr on file) calling regarding message from yesterday. Says patient is still tired and wondering if she should come in. [phone]

## 2015-07-08 ENCOUNTER — Emergency Department (HOSPITAL_COMMUNITY)
Admission: EM | Admit: 2015-07-08 | Discharge: 2015-07-08 | Disposition: A | Discharge status: Home / Self Care | Payer: Commercial Managed Care - HMO | Attending: Emergency Medicine | Admitting: Emergency Medicine

## 2015-07-08 ENCOUNTER — Encounter (HOSPITAL_COMMUNITY): Payer: Self-pay | Admitting: Emergency Medicine

## 2015-07-08 ENCOUNTER — Other Ambulatory Visit: Payer: Self-pay

## 2015-07-08 ENCOUNTER — Telehealth: Payer: Self-pay | Admitting: *Deleted

## 2015-07-08 ENCOUNTER — Emergency Department (HOSPITAL_COMMUNITY): Payer: Commercial Managed Care - HMO

## 2015-07-08 ENCOUNTER — Encounter: Payer: Self-pay | Admitting: Obstetrics and Gynecology

## 2015-07-08 ENCOUNTER — Ambulatory Visit (INDEPENDENT_AMBULATORY_CARE_PROVIDER_SITE_OTHER): Payer: Commercial Managed Care - HMO | Admitting: Obstetrics and Gynecology

## 2015-07-08 VITALS — BP 122/82 | HR 76 | Resp 25 | Ht 67.0 in | Wt 203.4 lb

## 2015-07-08 DIAGNOSIS — R0602 Shortness of breath: Secondary | ICD-10-CM

## 2015-07-08 DIAGNOSIS — N939 Abnormal uterine and vaginal bleeding, unspecified: Secondary | ICD-10-CM | POA: Diagnosis not present

## 2015-07-08 DIAGNOSIS — D649 Anemia, unspecified: Secondary | ICD-10-CM

## 2015-07-08 DIAGNOSIS — I10 Essential (primary) hypertension: Secondary | ICD-10-CM | POA: Diagnosis not present

## 2015-07-08 DIAGNOSIS — F329 Major depressive disorder, single episode, unspecified: Secondary | ICD-10-CM | POA: Diagnosis not present

## 2015-07-08 DIAGNOSIS — R0600 Dyspnea, unspecified: Secondary | ICD-10-CM

## 2015-07-08 DIAGNOSIS — R5382 Chronic fatigue, unspecified: Secondary | ICD-10-CM

## 2015-07-08 DIAGNOSIS — M069 Rheumatoid arthritis, unspecified: Secondary | ICD-10-CM | POA: Insufficient documentation

## 2015-07-08 DIAGNOSIS — Z87891 Personal history of nicotine dependence: Secondary | ICD-10-CM | POA: Diagnosis not present

## 2015-07-08 DIAGNOSIS — Z79899 Other long term (current) drug therapy: Secondary | ICD-10-CM | POA: Insufficient documentation

## 2015-07-08 LAB — CBC WITH DIFFERENTIAL/PLATELET
Basophils Absolute: 0.1 10*3/uL (ref 0.0–0.1)
Basophils Relative: 1 %
Eosinophils Absolute: 0.1 10*3/uL (ref 0.0–0.7)
Eosinophils Relative: 2 %
HCT: 31.5 % — ABNORMAL LOW (ref 36.0–46.0)
Hemoglobin: 10.3 g/dL — ABNORMAL LOW (ref 12.0–15.0)
Lymphocytes Relative: 28 %
Lymphs Abs: 1.6 10*3/uL (ref 0.7–4.0)
MCH: 27.2 pg (ref 26.0–34.0)
MCHC: 32.7 g/dL (ref 30.0–36.0)
MCV: 83.3 fL (ref 78.0–100.0)
Monocytes Absolute: 0.4 10*3/uL (ref 0.1–1.0)
Monocytes Relative: 7 %
Neutro Abs: 3.7 10*3/uL (ref 1.7–7.7)
Neutrophils Relative %: 62 %
Platelets: 378 10*3/uL (ref 150–400)
RBC: 3.78 MIL/uL — ABNORMAL LOW (ref 3.87–5.11)
RDW: 13.4 % (ref 11.5–15.5)
WBC: 5.8 10*3/uL (ref 4.0–10.5)

## 2015-07-08 LAB — I-STAT CHEM 8, ED
BUN: 12 mg/dL (ref 6–20)
Calcium, Ion: 1.14 mmol/L (ref 1.12–1.23)
Chloride: 101 mmol/L (ref 101–111)
Creatinine, Ser: 0.8 mg/dL (ref 0.44–1.00)
Glucose, Bld: 99 mg/dL (ref 65–99)
HCT: 34 % — ABNORMAL LOW (ref 36.0–46.0)
Hemoglobin: 11.6 g/dL — ABNORMAL LOW (ref 12.0–15.0)
Potassium: 3.3 mmol/L — ABNORMAL LOW (ref 3.5–5.1)
Sodium: 139 mmol/L (ref 135–145)
TCO2: 21 mmol/L (ref 0–100)

## 2015-07-08 LAB — D-DIMER, QUANTITATIVE: D-Dimer, Quant: <0.27 ug/mL-FEU (ref 0.00–0.50)

## 2015-07-08 LAB — TROPONIN I: Troponin I: <0.03 ng/mL (ref <0.031)

## 2015-07-08 NOTE — ED Notes (Signed)
Pulse ox 100 % ra and heart rate 129 dr aware

## 2015-07-08 NOTE — ED Notes (Addendum)
Pt has d & C and hystocopy last Friday  For a very heavy period that has been going on since feb and since has had sob on exertion, going up and down stairs and walking short distances, pt aaox3 had some chest pain last night =trying to sleep. Has just started back on bp meds 1 month ago after controlling it with diet for a whiole

## 2015-07-08 NOTE — Telephone Encounter (Signed)
Patient called at 3:15 pm to provide update after hospital discharge. States all testing for PE was negative and that ED feels her SOB is related to anemia. She was instructed to follow-up with [place] related to anemia so she is calling to see if there is any change to her instructions. Advised will update Dr Quincy Simmonds and call her back.

## 2015-07-08 NOTE — Progress Notes (Signed)
Patient ID: Nicole Figueroa, female   DOB: 05-Jun-1975, 40 y.o.   MRN: [id number] GYNECOLOGY  VISIT   HPI: 40 y.o.   Married  Caucasian  female   [phone] with Patient's last menstrual period was 07/07/2015 (exact date).   here for 1 week post DILATATION AND CURETTAGE /HYSTEROSCOPY (N/A). Pathology showing secretory polyp.  No hyperplasia or malignancy seen.  Stopped bleeding and just started again.  Patient is on Provera.  Patient complaining of fatigue and shortness of breath.   This has been going on for 1.5 weeks. Affecting sleep positions.  Has discomfort in her chest in certain positions.   Has some dizziness and lightheaded.  Some palpitations with activity.  Has usually headache almost daily.  Not sure it if is sinuses. Also has TMJ.  Patient works for [employer] services.   Hgb 10.7.  GYNECOLOGIC HISTORY: Patient's last menstrual period was 07/07/2015 (exact date). Contraception:  vasectomy Menopausal hormone therapy:  n/a Last mammogram:  n/a Last pap smear: 2 years ago normal per patient. Had one repeat pap 10 - 12 years ago with repeat normal.           OB History    Gravida Para Term Preterm AB TAB SAB Ectopic Multiple Living   4 4 4       4          There are no active problems to display for this patient.   Past Medical History  Diagnosis Date  . Depression     when had a loss in family--fam hx of depression  . Abnormal uterine bleeding 2016, 06/2015  . Hypertension   . RA (rheumatoid arthritis) ([place]) 2013    --trying to control with diet--followed by PCP  . Anemia     taking iron supplement  . Shortness of breath dyspnea     w/ activity ? d/t anemia  . TMJ dysfunction     bilateral  . Wears glasses     Past Surgical History  Procedure Laterality Date  . Broken foot Right 08/2014  . Hysteroscopy w/d&c N/A 07/03/2015    Procedure: DILATATION AND CURETTAGE /HYSTEROSCOPY;  Surgeon: Nunzio Cobbs, MD;  Location: [facility];   Service: Gynecology;  Laterality: N/A;    Current Outpatient Prescriptions  Medication Sig Dispense Refill  . ferrous sulfate 325 (65 FE) MG tablet Take 325 mg by mouth daily with breakfast.    . FLUoxetine (PROZAC) 20 MG tablet Take 1 tablet by mouth daily.    . hydrochlorothiazide (HYDRODIURIL) 25 MG tablet Take 1 tablet by mouth daily.    Marland Kitchen ibuprofen (ADVIL,MOTRIN) 800 MG tablet Take 1 tablet (800 mg total) by mouth every 8 (eight) hours as needed. 30 tablet 0  . medroxyPROGESTERone (PROVERA) 10 MG tablet Take 1 tablet (10 mg total) by mouth daily. 10 tablet 0   No current facility-administered medications for this visit.     ALLERGIES: Review of patient's allergies indicates no known allergies.  Family History  Problem Relation Age of Onset  . Heart disease Father     dec age 36  . Hypertension Mother   . Hypertension Maternal Grandmother   . Stroke Maternal Grandmother   . Cancer Maternal Aunt     dec brain ca--glioblastoma  . Cancer Maternal Grandfather     Dec Glioblastoma  . Cancer Other     Dec Glioblastoma    Social History   Social History  . Marital Status: Married    Spouse  Name: N/A  . Number of Children: N/A  . Years of Education: N/A   Occupational History  . Not on file.   Social History Main Topics  . Smoking status: Former Smoker -- 2.00 packs/day    Types: Cigarettes    Quit date: 03/07/2006  . Smokeless tobacco: Not on file  . Alcohol Use: 1.2 - 1.8 oz/week    2-3 Standard drinks or equivalent per week     Comment: 3-5 glasses of wine/week of occ. marguerita  . Drug Use: No  . Sexual Activity:    Partners: Male    Birth Control/ Protection: Other-see comments     Comment: husband with vasectomy   Other Topics Concern  . Not on file   Social History Narrative    ROS:  Pertinent items are noted in HPI.  PHYSICAL EXAMINATION:    BP 122/82 mmHg  Pulse 76  Resp 25  Ht 5' 7"  (1.702 m)  Wt 203 lb 6.4 oz (92.262 kg)  BMI 31.85 kg/m2   LMP 07/07/2015 (Exact Date)    General appearance: alert, cooperative and appears stated age Heart: tachycardia.  Abdomen: soft, non-tender, no masses,  no organomegaly Extremities: extremities normal, atraumatic, no cyanosis or edema Skin: Skin color, texture, turgor normal. No rashes or lesions Neurologic: Grossly normal  Pelvic: External genitalia:  no lesions              Urethra:  normal appearing urethra with no masses, tenderness or lesions              Bartholins and Skenes: normal                 Vagina: normal appearing vagina with normal color and discharge, no lesions.  Small amount of menstrual blood.              Cervix: no lesions             Bimanual Exam:  Uterus:  normal size, contour, position, consistency, mobility, non-tender              Adnexa: normal adnexa and no mass, fullness, tenderness       Chaperone was present for exam.  ASSESSMENT  Status post hysteroscopy with dilation and curettage.  Anemia.  Capillary hemoglobin 10.7. Shortness of breath.  Positional chest pain.  HTN.  FH of CAD and early cardiac death.  PLAN  Will have patient transported to [facility] now for work up of SOB and chest pain.  Her husband is coming to our office now to accompany her to the hospital.  Plan for post op treatment of menometrorrhagia to follow.    An After Visit Summary was printed and given to the patient.  __15____ minutes face to face time of which over 50% was spent in counseling.

## 2015-07-08 NOTE — Telephone Encounter (Signed)
I am glad she did not have a PE or cardiac issue.  I recommend iron sulfate 325 mg by mouth twice a day.  Take with orange juice to help absorption.  Red meat, spinach, and raisins naturally contain iron.  Return for a recheck in 2 weeks.

## 2015-07-08 NOTE — Discharge Instructions (Signed)
Anemia, Nonspecific Anemia is a condition in which the concentration of red blood cells or hemoglobin in the blood is below normal. Hemoglobin is a substance in red blood cells that carries oxygen to the tissues of the body. Anemia results in not enough oxygen reaching these tissues.  CAUSES  Common causes of anemia include:   Excessive bleeding. Bleeding may be internal or external. This includes excessive bleeding from periods (in women) or from the intestine.   Poor nutrition.   Chronic kidney, thyroid, and liver disease.  Bone marrow disorders that decrease red blood cell production.  Cancer and treatments for cancer.  HIV, AIDS, and their treatments.  Spleen problems that increase red blood cell destruction.  Blood disorders.  Excess destruction of red blood cells due to infection, medicines, and autoimmune disorders. SIGNS AND SYMPTOMS   Minor weakness.   Dizziness.   Headache.  Palpitations.   Shortness of breath, especially with exercise.   Paleness.  Cold sensitivity.  Indigestion.  Nausea.  Difficulty sleeping.  Difficulty concentrating. Symptoms may occur suddenly or they may develop slowly.  DIAGNOSIS  Additional blood tests are often needed. These help your health care provider determine the best treatment. Your health care provider will check your stool for blood and look for other causes of blood loss.  TREATMENT  Treatment varies depending on the cause of the anemia. Treatment can include:   Supplements of iron, vitamin B12, or folic acid.   Hormone medicines.   A blood transfusion. This may be needed if blood loss is severe.   Hospitalization. This may be needed if there is significant continual blood loss.   Dietary changes.  Spleen removal. HOME CARE INSTRUCTIONS Keep all follow-up appointments. It often takes many weeks to correct anemia, and having your health care provider check on your condition and your response to  treatment is very important. SEEK IMMEDIATE MEDICAL CARE IF:   You develop extreme weakness, shortness of breath, or chest pain.   You become dizzy or have trouble concentrating.  You develop heavy vaginal bleeding.   You develop a rash.   You have bloody or black, tarry stools.   You faint.   You vomit up blood.   You vomit repeatedly.   You have abdominal pain.  You have a fever or persistent symptoms for more than 2-3 days.   You have a fever and your symptoms suddenly get worse.   You are dehydrated.  MAKE SURE YOU:  Understand these instructions.  Will watch your condition.  Will get help right away if you are not doing well or get worse.   This information is not intended to replace advice given to you by your health care provider. Make sure you discuss any questions you have with your health care provider.   Document Released: 03/31/2004 Document Revised: 10/24/2012 Document Reviewed: 08/17/2012 Elsevier Interactive Patient Education 2016 Elsevier Inc.  

## 2015-07-08 NOTE — ED Provider Notes (Signed)
CSN: [id number]     Arrival date & time 07/08/15  1146 History   First MD Initiated Contact with Patient 07/08/15 1150     Chief Complaint  Patient presents with  . Shortness of Breath      Patient is a 40 y.o. female presenting with shortness of breath. The history is provided by the patient.  Shortness of Breath Severity:  Moderate Associated symptoms: no abdominal pain, no chest pain, no headaches, no rash and no vomiting    patient presents with shortness of breath and chest pain. Sent in from gynecology. She's had dysfunctional uterine bleeding and had a D&C for it. Since the D&C approximately week ago she's had more shortness of breath and chest pain. No fevers or chills. States she's had decreased activity. Hemoglobin gone down to 9.7 but bleeding is now decreased. Reported hemoglobin of 10.1 today. No swelling or legs reported. Sent in to rule out pulmonary embolism. She had a slight episode of chest pain last night but his been short of breath for the last several days.  Past Medical History  Diagnosis Date  . Depression     when had a loss in family--fam hx of depression  . Abnormal uterine bleeding 2016, 06/2015  . Hypertension   . RA (rheumatoid arthritis) ([place]) 2013    --trying to control with diet--followed by PCP  . Anemia     taking iron supplement  . Shortness of breath dyspnea     w/ activity ? d/t anemia  . TMJ dysfunction     bilateral  . Wears glasses    Past Surgical History  Procedure Laterality Date  . Broken foot Right 08/2014  . Hysteroscopy w/d&c N/A 07/03/2015    Procedure: DILATATION AND CURETTAGE /HYSTEROSCOPY;  Surgeon: Nunzio Cobbs, MD;  Location: [facility];  Service: Gynecology;  Laterality: N/A;   Family History  Problem Relation Age of Onset  . Heart disease Father     dec age 7  . Hypertension Mother   . Hypertension Maternal Grandmother   . Stroke Maternal Grandmother   . Cancer Maternal Aunt     dec brain  ca--glioblastoma  . Cancer Maternal Grandfather     Dec Glioblastoma  . Cancer Other     Dec Glioblastoma   Social History  Substance Use Topics  . Smoking status: Former Smoker -- 2.00 packs/day    Types: Cigarettes    Quit date: 03/07/2006  . Smokeless tobacco: None  . Alcohol Use: 1.2 - 1.8 oz/week    2-3 Standard drinks or equivalent per week     Comment: 3-5 glasses of wine/week of occ. marguerita   OB History    Gravida Para Term Preterm AB TAB SAB Ectopic Multiple Living   4 4 4       4      Review of Systems  Constitutional: Positive for appetite change and fatigue. Negative for activity change.  Eyes: Negative for pain.  Respiratory: Positive for shortness of breath. Negative for chest tightness.   Cardiovascular: Negative for chest pain and leg swelling.  Gastrointestinal: Negative for vomiting, abdominal pain and diarrhea.  Genitourinary: Positive for vaginal bleeding. Negative for flank pain.  Musculoskeletal: Negative for back pain and neck stiffness.  Skin: Negative for rash.  Neurological: Negative for weakness, numbness and headaches.  Psychiatric/Behavioral: Negative for behavioral problems.      Allergies  Review of patient's allergies indicates no known allergies.  Home Medications   Prior  to Admission medications   Medication Sig Start Date End Date Taking? Authorizing Provider  ferrous sulfate 325 (65 FE) MG tablet Take 325 mg by mouth daily with breakfast.   Yes Historical Provider, MD  FLUoxetine (PROZAC) 20 MG tablet Take 1 tablet by mouth daily. 05/12/15  Yes Historical Provider, MD  hydrochlorothiazide (HYDRODIURIL) 25 MG tablet Take 1 tablet by mouth daily. 04/13/15  Yes Historical Provider, MD  ibuprofen (ADVIL,MOTRIN) 800 MG tablet Take 1 tablet (800 mg total) by mouth every 8 (eight) hours as needed. 07/03/15  Yes [place], MD  medroxyPROGESTERone (PROVERA) 10 MG tablet Take 1 tablet (10 mg total) by mouth daily. 07/01/15  Yes  [place], MD  Multiple Vitamin (MULTIVITAMIN WITH MINERALS) TABS tablet Take 1 tablet by mouth daily.   Yes Historical Provider, MD   BP 127/97 mmHg  Pulse 95  Temp(Src) 98.5 F (36.9 C) (Oral)  Resp 16  SpO2 100%  LMP 07/07/2015 (Exact Date) Physical Exam  Constitutional: She appears well-developed and well-nourished.  HENT:  Head: Normocephalic and atraumatic.  Eyes: Pupils are equal, round, and reactive to light.  Neck: No JVD present.  Cardiovascular: Normal rate and regular rhythm.   No murmur heard. Pulmonary/Chest: Effort normal and breath sounds normal. No respiratory distress. She has no wheezes.  Abdominal: Soft. Bowel sounds are normal. She exhibits no distension. There is no tenderness. There is no rebound.  Musculoskeletal: Normal range of motion.  Left lower leg may be mildly larger than contralateral side. No real edema.  Neurological: She is alert.  Skin: Skin is warm and dry.  Psychiatric: She has a normal mood and affect. Her speech is normal.  Nursing note and vitals reviewed.   ED Course  Procedures (including critical care time) Labs Review Labs Reviewed  CBC WITH DIFFERENTIAL/PLATELET - Abnormal; Notable for the following:    RBC 3.78 (*)    Hemoglobin 10.3 (*)    HCT 31.5 (*)    All other components within normal limits  I-STAT CHEM 8, ED - Abnormal; Notable for the following:    Potassium 3.3 (*)    Hemoglobin 11.6 (*)    HCT 34.0 (*)    All other components within normal limits  TROPONIN I  D-DIMER, QUANTITATIVE (NOT AT [facility])    Imaging Review Dg Chest 2 View  07/08/2015  CLINICAL DATA:  Shortness of breath with exertion for 1 week EXAM: CHEST  2 VIEW COMPARISON:  December 06, 2010 FINDINGS: Lungs are clear. Heart size and pulmonary vascularity are normal. No adenopathy. No bone lesions. IMPRESSION: No edema or consolidation. Electronically Signed   By: Lowella Grip III M.D.   On: 07/08/2015 12:47   I have personally  reviewed and evaluated these images and lab results as part of my medical decision-making.   EKG Interpretation   Date/Time:  Wednesday Jul 08 2015 11:51:27 EDT Ventricular Rate:  85 PR Interval:  135 QRS Duration: 92 QT Interval:  352 QTC Calculation: 418 R Axis:   51 Text Interpretation:  Sinus rhythm Borderline repolarization abnormality  Confirmed by Alvino Chapel  MD, Ovid Curd [phone]) on 07/08/2015 12:28:50 PM      MDM   Final diagnoses:  Anemia, unspecified anemia type  Dyspnea    Patient with anemia. Sent in to rule out pulmonary embolism. Negative d-dimer. Hemoglobin is stable. Will discharge home to follow-up as needed.    Davonna Belling, MD 07/08/15 1515

## 2015-07-09 NOTE — Telephone Encounter (Signed)
Call to patient. Notified of instructions from Dr Quincy Simmonds.  Post op appointment rescheduled to 07-23-15 which will be 2 weeks from now.  Encounter closed.

## 2015-07-14 LAB — HEMOGLOBIN, FINGERSTICK: Hemoglobin, fingerstick: 10.7 g/dL — ABNORMAL LOW (ref 12.0–16.0)

## 2015-07-17 ENCOUNTER — Ambulatory Visit: Payer: Commercial Managed Care - HMO | Admitting: Obstetrics and Gynecology

## 2015-07-23 ENCOUNTER — Ambulatory Visit (INDEPENDENT_AMBULATORY_CARE_PROVIDER_SITE_OTHER): Payer: Commercial Managed Care - HMO | Admitting: Obstetrics and Gynecology

## 2015-07-23 ENCOUNTER — Encounter: Payer: Self-pay | Admitting: Obstetrics and Gynecology

## 2015-07-23 VITALS — BP 120/74 | HR 84 | Ht 67.0 in | Wt 207.4 lb

## 2015-07-23 DIAGNOSIS — N921 Excessive and frequent menstruation with irregular cycle: Secondary | ICD-10-CM | POA: Diagnosis not present

## 2015-07-23 DIAGNOSIS — D62 Acute posthemorrhagic anemia: Secondary | ICD-10-CM

## 2015-07-23 LAB — CBC
HCT: 35.5 % (ref 35.0–45.0)
Hemoglobin: 11.2 g/dL — ABNORMAL LOW (ref 11.7–15.5)
MCH: 27.7 pg (ref 27.0–33.0)
MCHC: 31.5 g/dL — ABNORMAL LOW (ref 32.0–36.0)
MCV: 87.9 fL (ref 80.0–100.0)
MPV: 9.5 fL (ref 7.5–12.5)
Platelets: 379 10*3/uL (ref 140–400)
RBC: 4.04 MIL/uL (ref 3.80–5.10)
RDW: 16.8 % — ABNORMAL HIGH (ref 11.0–15.0)
WBC: 4.9 10*3/uL (ref 3.8–10.8)

## 2015-07-23 NOTE — Progress Notes (Signed)
Patient ID: Nicole Figueroa, female   DOB: [birth year], 40 y.o.   MRN: [id number] GYNECOLOGY  VISIT   HPI: 40 y.o.   Married  Caucasian  female   [phone] with Patient's last menstrual period was 07/07/2015 (exact date).   here for 3 week follow up. DILATATION AND CURETTAGE /HYSTEROSCOPY (N/A).     Had bleeding for 2 months prior to this.  Last year had bleeding for one month that was heavy and resulted in anemia. Feeling much better overall. Taking iron.  Diet rich in iron as well.  No bleeding at all.   See in the ER following last visit for fatigue and SOB.  Had negative D-dimer No concern for PE.  Dx with continued anemia.  Hgb 10.3.  Declines future childbearing and is considering hysterectomy.  Husband has had vasectomy.   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  GYNECOLOGIC HISTORY: Patient's last menstrual period was 07/07/2015 (exact date). Contraception:  Vasectomy Menopausal hormone therapy:  n/a Last mammogram:  n/a Last pap smear:  2 years ago normal per patient.        OB History    Gravida Para Term Preterm AB TAB SAB Ectopic Multiple Living   4 4 4       4          There are no active problems to display for this patient.   Past Medical History  Diagnosis Date  . Depression     when had a loss in family--fam hx of depression  . Abnormal uterine bleeding 2016, 06/2015  . Hypertension   . RA (rheumatoid arthritis) ([place]) 2013    --trying to control with diet--followed by PCP  . Anemia     taking iron supplement  . Shortness of breath dyspnea     w/ activity ? d/t anemia  . TMJ dysfunction     bilateral  . Wears glasses     Past Surgical History  Procedure Laterality Date  . Broken foot Right 08/2014  . Hysteroscopy w/d&c N/A 07/03/2015    Procedure: DILATATION AND CURETTAGE /HYSTEROSCOPY;  Surgeon: Nunzio Cobbs, MD;  Location: [facility];  Service: Gynecology;  Laterality: N/A;    Current Outpatient Prescriptions  Medication Sig  Dispense Refill  . ferrous sulfate 325 (65 FE) MG tablet Take 325 mg by mouth daily with breakfast.    . FLUoxetine (PROZAC) 20 MG tablet Take 1 tablet by mouth daily.    . hydrochlorothiazide (HYDRODIURIL) 25 MG tablet Take 1 tablet by mouth daily.    Marland Kitchen ibuprofen (ADVIL,MOTRIN) 800 MG tablet Take 1 tablet (800 mg total) by mouth every 8 (eight) hours as needed. 30 tablet 0  . Multiple Vitamin (MULTIVITAMIN WITH MINERALS) TABS tablet Take 1 tablet by mouth daily.     No current facility-administered medications for this visit.     ALLERGIES: Review of patient's allergies indicates no known allergies.  Family History  Problem Relation Age of Onset  . Heart disease Father     dec age 38  . Hypertension Mother   . Hypertension Maternal Grandmother   . Stroke Maternal Grandmother   . Cancer Maternal Aunt     dec brain ca--glioblastoma  . Cancer Maternal Grandfather     Dec Glioblastoma  . Cancer Other     Dec Glioblastoma    Social History   Social History  . Marital Status: Married    Spouse Name: N/A  . Number of Children: N/A  . Years of  Education: N/A   Occupational History  . Not on file.   Social History Main Topics  . Smoking status: Former Smoker -- 2.00 packs/day    Types: Cigarettes    Quit date: 03/07/2006  . Smokeless tobacco: Not on file  . Alcohol Use: 1.2 - 1.8 oz/week    2-3 Standard drinks or equivalent per week     Comment: 3-5 glasses of wine/week of occ. marguerita  . Drug Use: No  . Sexual Activity:    Partners: Male    Birth Control/ Protection: Other-see comments     Comment: husband with vasectomy   Other Topics Concern  . Not on file   Social History Narrative    ROS:  Pertinent items are noted in HPI.  PHYSICAL EXAMINATION:    BP 120/74 mmHg  Pulse 84  Ht 5' 7"  (1.702 m)  Wt 207 lb 6.4 oz (94.076 kg)  BMI 32.48 kg/m2  LMP 07/07/2015 (Exact Date)    General appearance: alert, cooperative and appears stated age   Pelvic:  External genitalia:  no lesions              Urethra:  normal appearing urethra with no masses, tenderness or lesions              Bartholins and Skenes: normal                 Vagina: normal appearing vagina with normal color and discharge, no lesions              Cervix: no lesions             Bimanual Exam:  Uterus:  normal size, contour, position, consistency, mobility, non-tender              Adnexa: normal adnexa and no mass, fullness, tenderness                Chaperone was present for exam.  ASSESSMENT  Status post hysteroscopy with dilation and curettage.  Recurrent episodes of menometrorrhagia. Anemia.   HTN.   PLAN  Check Hgb today.  CBC then ordered for confirmation purposes. Discussion of possibilities for future control of cycles - progesterone based contraception - Micronor, Depo Provera, Nexplanon, Mirena IUD.  Discussed endometrial ablation and hysterectomy also.  Detailed discussion regarding Mirena IUD.  Handout also given.   Patient will consider her options and report back as needed.   An After Visit Summary was printed and given to the patient.  __15____ minutes face to face time of which over 50% was spent in counseling.

## 2015-08-27 ENCOUNTER — Other Ambulatory Visit: Payer: Self-pay | Admitting: Sports Medicine

## 2015-08-27 DIAGNOSIS — M545 Low back pain: Secondary | ICD-10-CM

## 2015-09-03 ENCOUNTER — Ambulatory Visit
Admission: RE | Admit: 2015-09-03 | Discharge: 2015-09-03 | Disposition: A | Discharge status: Home / Self Care | Payer: Commercial Managed Care - HMO | Source: Ambulatory Visit | Attending: Sports Medicine | Admitting: Sports Medicine

## 2015-09-03 DIAGNOSIS — M545 Low back pain: Secondary | ICD-10-CM

## 2015-09-05 ENCOUNTER — Emergency Department (HOSPITAL_COMMUNITY): Payer: Commercial Managed Care - HMO

## 2015-09-05 ENCOUNTER — Observation Stay (HOSPITAL_COMMUNITY)
Admission: EM | Admit: 2015-09-05 | Discharge: 2015-09-06 | Disposition: A | Discharge status: Home / Self Care | Payer: Commercial Managed Care - HMO | Attending: Internal Medicine | Admitting: Internal Medicine

## 2015-09-05 ENCOUNTER — Encounter (HOSPITAL_COMMUNITY): Payer: Self-pay | Admitting: Emergency Medicine

## 2015-09-05 DIAGNOSIS — M545 Low back pain, unspecified: Secondary | ICD-10-CM | POA: Diagnosis present

## 2015-09-05 DIAGNOSIS — Z823 Family history of stroke: Secondary | ICD-10-CM | POA: Insufficient documentation

## 2015-09-05 DIAGNOSIS — R319 Hematuria, unspecified: Secondary | ICD-10-CM | POA: Diagnosis not present

## 2015-09-05 DIAGNOSIS — Z79899 Other long term (current) drug therapy: Secondary | ICD-10-CM | POA: Insufficient documentation

## 2015-09-05 DIAGNOSIS — M5126 Other intervertebral disc displacement, lumbar region: Secondary | ICD-10-CM | POA: Insufficient documentation

## 2015-09-05 DIAGNOSIS — K429 Umbilical hernia without obstruction or gangrene: Secondary | ICD-10-CM | POA: Diagnosis not present

## 2015-09-05 DIAGNOSIS — E860 Dehydration: Secondary | ICD-10-CM | POA: Insufficient documentation

## 2015-09-05 DIAGNOSIS — Z87891 Personal history of nicotine dependence: Secondary | ICD-10-CM | POA: Diagnosis not present

## 2015-09-05 DIAGNOSIS — K760 Fatty (change of) liver, not elsewhere classified: Secondary | ICD-10-CM | POA: Diagnosis not present

## 2015-09-05 DIAGNOSIS — Z808 Family history of malignant neoplasm of other organs or systems: Secondary | ICD-10-CM | POA: Insufficient documentation

## 2015-09-05 DIAGNOSIS — F329 Major depressive disorder, single episode, unspecified: Secondary | ICD-10-CM | POA: Insufficient documentation

## 2015-09-05 DIAGNOSIS — G8929 Other chronic pain: Secondary | ICD-10-CM | POA: Diagnosis not present

## 2015-09-05 DIAGNOSIS — M5430 Sciatica, unspecified side: Secondary | ICD-10-CM | POA: Diagnosis present

## 2015-09-05 DIAGNOSIS — M069 Rheumatoid arthritis, unspecified: Secondary | ICD-10-CM | POA: Insufficient documentation

## 2015-09-05 DIAGNOSIS — I10 Essential (primary) hypertension: Secondary | ICD-10-CM | POA: Diagnosis not present

## 2015-09-05 DIAGNOSIS — Z8249 Family history of ischemic heart disease and other diseases of the circulatory system: Secondary | ICD-10-CM | POA: Insufficient documentation

## 2015-09-05 DIAGNOSIS — N390 Urinary tract infection, site not specified: Secondary | ICD-10-CM | POA: Insufficient documentation

## 2015-09-05 DIAGNOSIS — R3915 Urgency of urination: Secondary | ICD-10-CM | POA: Diagnosis present

## 2015-09-05 LAB — BASIC METABOLIC PANEL
Anion gap: 10 (ref 5–15)
BUN: 11 mg/dL (ref 6–20)
CO2: 22 mmol/L (ref 22–32)
Calcium: 8.5 mg/dL — ABNORMAL LOW (ref 8.9–10.3)
Chloride: 106 mmol/L (ref 101–111)
Creatinine, Ser: 0.71 mg/dL (ref 0.44–1.00)
GFR calc Af Amer: >60 mL/min (ref >60)
GFR calc non Af Amer: >60 mL/min (ref >60)
Glucose, Bld: 111 mg/dL — ABNORMAL HIGH (ref 65–99)
Potassium: 3.6 mmol/L (ref 3.5–5.1)
Sodium: 138 mmol/L (ref 135–145)

## 2015-09-05 LAB — CBC WITH DIFFERENTIAL/PLATELET
Basophils Absolute: 0 10*3/uL (ref 0.0–0.1)
Basophils Relative: 0 %
Eosinophils Absolute: 0.1 10*3/uL (ref 0.0–0.7)
Eosinophils Relative: 2 %
HCT: 39.5 % (ref 36.0–46.0)
Hemoglobin: 12.9 g/dL (ref 12.0–15.0)
Lymphocytes Relative: 11 %
Lymphs Abs: 0.8 10*3/uL (ref 0.7–4.0)
MCH: 27.4 pg (ref 26.0–34.0)
MCHC: 32.7 g/dL (ref 30.0–36.0)
MCV: 83.9 fL (ref 78.0–100.0)
Monocytes Absolute: 0.5 10*3/uL (ref 0.1–1.0)
Monocytes Relative: 8 %
Neutro Abs: 5.6 10*3/uL (ref 1.7–7.7)
Neutrophils Relative %: 79 %
Platelets: 313 10*3/uL (ref 150–400)
RBC: 4.71 MIL/uL (ref 3.87–5.11)
RDW: 14.1 % (ref 11.5–15.5)
WBC: 7 10*3/uL (ref 4.0–10.5)

## 2015-09-05 LAB — URINE MICROSCOPIC-ADD ON: RBC / HPF: NONE SEEN RBC/hpf (ref 0–5)

## 2015-09-05 LAB — URINALYSIS, ROUTINE W REFLEX MICROSCOPIC
Bilirubin Urine: NEGATIVE
Glucose, UA: NEGATIVE mg/dL
Hgb urine dipstick: NEGATIVE
Ketones, ur: NEGATIVE mg/dL
Nitrite: NEGATIVE
Protein, ur: NEGATIVE mg/dL
Specific Gravity, Urine: 1.009 (ref 1.005–1.030)
pH: 5.5 (ref 5.0–8.0)

## 2015-09-05 LAB — POC URINE PREG, ED: Preg Test, Ur: NEGATIVE

## 2015-09-05 MED ORDER — ADULT MULTIVITAMIN W/MINERALS CH
1.0000 | ORAL_TABLET | Freq: Every day | ORAL | Status: DC
  Administered 2015-09-06: 1 via ORAL
  Filled 2015-09-05: qty 1

## 2015-09-05 MED ORDER — ONDANSETRON HCL 4 MG/2ML IJ SOLN
4.0000 mg | Freq: Once | INTRAMUSCULAR | Status: AC
  Administered 2015-09-05: 4 mg via INTRAVENOUS
  Filled 2015-09-05: qty 2

## 2015-09-05 MED ORDER — SODIUM CHLORIDE 0.9 % IV BOLUS (SEPSIS)
1000.0000 mL | Freq: Once | INTRAVENOUS | Status: AC
  Administered 2015-09-05: 1000 mL via INTRAVENOUS

## 2015-09-05 MED ORDER — HYDROMORPHONE HCL 1 MG/ML IJ SOLN
0.5000 mg | Freq: Once | INTRAMUSCULAR | Status: AC
  Administered 2015-09-05: 0.5 mg via INTRAVENOUS
  Filled 2015-09-05: qty 1

## 2015-09-05 MED ORDER — SODIUM CHLORIDE 0.9% FLUSH
3.0000 mL | Freq: Two times a day (BID) | INTRAVENOUS | Status: DC
  Administered 2015-09-05: 3 mL via INTRAVENOUS

## 2015-09-05 MED ORDER — HYDRALAZINE HCL 20 MG/ML IJ SOLN: 5.0000 mg | INTRAMUSCULAR | Status: DC | PRN

## 2015-09-05 MED ORDER — ACETAMINOPHEN 650 MG RE SUPP: 650.0000 mg | Freq: Four times a day (QID) | RECTAL | Status: DC | PRN

## 2015-09-05 MED ORDER — FOSFOMYCIN TROMETHAMINE 3 G PO PACK
3.0000 g | PACK | Freq: Once | ORAL | Status: AC
  Administered 2015-09-05: 3 g via ORAL
  Filled 2015-09-05: qty 3

## 2015-09-05 MED ORDER — KETOROLAC TROMETHAMINE 30 MG/ML IJ SOLN
30.0000 mg | Freq: Once | INTRAMUSCULAR | Status: AC
  Administered 2015-09-05: 30 mg via INTRAVENOUS
  Filled 2015-09-05: qty 1

## 2015-09-05 MED ORDER — ONDANSETRON HCL 4 MG/2ML IJ SOLN: 4.0000 mg | Freq: Three times a day (TID) | INTRAMUSCULAR | Status: DC | PRN

## 2015-09-05 MED ORDER — OXYCODONE-ACETAMINOPHEN 5-325 MG PO TABS
2.0000 | ORAL_TABLET | Freq: Four times a day (QID) | ORAL | Status: DC | PRN
  Administered 2015-09-05 – 2015-09-06 (×3): 2 via ORAL
  Filled 2015-09-05 (×3): qty 2

## 2015-09-05 MED ORDER — MELOXICAM 7.5 MG PO TABS
7.5000 mg | ORAL_TABLET | Freq: Every day | ORAL | Status: DC
  Administered 2015-09-05 – 2015-09-06 (×2): 7.5 mg via ORAL
  Filled 2015-09-05 (×2): qty 1

## 2015-09-05 MED ORDER — METHOCARBAMOL 500 MG PO TABS
750.0000 mg | ORAL_TABLET | Freq: Four times a day (QID) | ORAL | Status: DC | PRN
  Administered 2015-09-05: 750 mg via ORAL
  Filled 2015-09-05: qty 2

## 2015-09-05 MED ORDER — HYDROMORPHONE HCL 1 MG/ML IJ SOLN: 0.5000 mg | Freq: Once | INTRAMUSCULAR | Status: DC

## 2015-09-05 MED ORDER — HYDROCHLOROTHIAZIDE 25 MG PO TABS
25.0000 mg | ORAL_TABLET | Freq: Every day | ORAL | Status: DC
  Administered 2015-09-06: 25 mg via ORAL
  Filled 2015-09-05: qty 1

## 2015-09-05 MED ORDER — ENOXAPARIN SODIUM 40 MG/0.4ML ~~LOC~~ SOLN
40.0000 mg | Freq: Every day | SUBCUTANEOUS | Status: DC
  Administered 2015-09-05: 40 mg via SUBCUTANEOUS
  Filled 2015-09-05: qty 0.4

## 2015-09-05 MED ORDER — METHOCARBAMOL 500 MG PO TABS
1000.0000 mg | ORAL_TABLET | Freq: Once | ORAL | Status: AC
  Administered 2015-09-05: 1000 mg via ORAL
  Filled 2015-09-05: qty 2

## 2015-09-05 MED ORDER — ACETAMINOPHEN 325 MG PO TABS
650.0000 mg | ORAL_TABLET | Freq: Four times a day (QID) | ORAL | Status: DC | PRN
  Administered 2015-09-05 – 2015-09-06 (×2): 650 mg via ORAL
  Filled 2015-09-05 (×2): qty 2

## 2015-09-05 MED ORDER — MAGNESIUM SALICYLATE TETRAHYD 580 MG PO TABS: 2.0000 | ORAL_TABLET | Freq: Every day | ORAL | Status: DC | PRN

## 2015-09-05 MED ORDER — IBUPROFEN 200 MG PO TABS: 400.0000 mg | ORAL_TABLET | Freq: Three times a day (TID) | ORAL | Status: DC

## 2015-09-05 NOTE — ED Notes (Signed)
Pt c/o lower back pain and flank pain since a DILATATION AND CURETTAGE she had at the end of April. Pt states past couple of week she has noticed a pink tinge to her urine. Pts PCP did an Xray that showed nothing wrong and just had an MRI and is awaiting results. Pt c/o nausea since yesterday.

## 2015-09-05 NOTE — ED Notes (Signed)
Pt cannot use restroom at this time, aware urine specimen is needed.  

## 2015-09-05 NOTE — ED Provider Notes (Signed)
CSN: [id number]     Arrival date & time 09/05/15  1629 History   First MD Initiated Contact with Patient 09/05/15 1643     Chief Complaint  Patient presents with  . Back Pain  . Flank Pain   (Consider location/radiation/quality/duration/timing/severity/associated sxs/prior Treatment) HPI   Blood pressure 163/123, pulse 124, temperature 98 F (36.7 C), temperature source Oral, resp. rate 18, SpO2 100 %.  Nicole Figueroa is a 40 y.o. female with past medical history significant for rheumatoid arthritis complaining of exacerbation of low back pain which she's had status post D&C at the end of April significantly worsened over the last several days, she's also noticed a pinkish tinge to her urine and had an episode of nausea yesterday with no emesis. She has sensation of incomplete void with no dysuria, hematuria, rash, lesion, vaginal discharge.  Past Medical History  Diagnosis Date  . Depression     when had a loss in family--fam hx of depression  . Abnormal uterine bleeding 2016, 06/2015  . Hypertension   . RA (rheumatoid arthritis) ([place]) 2013    --trying to control with diet--followed by PCP  . Anemia     taking iron supplement  . Shortness of breath dyspnea     w/ activity ? d/t anemia  . TMJ dysfunction     bilateral  . Wears glasses    Past Surgical History  Procedure Laterality Date  . Broken foot Right 08/2014  . Hysteroscopy w/d&c N/A 07/03/2015    Procedure: DILATATION AND CURETTAGE /HYSTEROSCOPY;  Surgeon: Nunzio Cobbs, MD;  Location: [facility];  Service: Gynecology;  Laterality: N/A;   Family History  Problem Relation Age of Onset  . Heart disease Father     dec age 61  . Hypertension Mother   . Hypertension Maternal Grandmother   . Stroke Maternal Grandmother   . Cancer Maternal Aunt     dec brain ca--glioblastoma  . Cancer Maternal Grandfather     Dec Glioblastoma  . Cancer Other     Dec Glioblastoma   Social History  Substance  Use Topics  . Smoking status: Former Smoker -- 2.00 packs/day    Types: Cigarettes    Quit date: 03/07/2006  . Smokeless tobacco: None  . Alcohol Use: 1.2 - 1.8 oz/week    2-3 Standard drinks or equivalent per week     Comment: 3-5 glasses of wine/week of occ. marguerita   OB History    Gravida Para Term Preterm AB TAB SAB Ectopic Multiple Living   4 4 4       4      Review of Systems  10 systems reviewed and found to be negative, except as noted in the HPI.  Allergies  Review of patient's allergies indicates no known allergies.  Home Medications   Prior to Admission medications   Medication Sig Start Date End Date Taking? Authorizing Provider  ferrous sulfate 325 (65 FE) MG tablet Take 325 mg by mouth daily with breakfast.    Historical Provider, MD  FLUoxetine (PROZAC) 20 MG tablet Take 1 tablet by mouth daily. 05/12/15   Historical Provider, MD  hydrochlorothiazide (HYDRODIURIL) 25 MG tablet Take 1 tablet by mouth daily. 04/13/15   Historical Provider, MD  ibuprofen (ADVIL,MOTRIN) 800 MG tablet Take 1 tablet (800 mg total) by mouth every 8 (eight) hours as needed. 07/03/15   [place], MD  Multiple Vitamin (MULTIVITAMIN WITH MINERALS) TABS tablet Take 1 tablet by  mouth daily.    Historical Provider, MD   BP 164/105 mmHg  Pulse 130  Temp(Src) 98 F (36.7 C) (Oral)  Resp 18  SpO2 100% Physical Exam  Constitutional: She is oriented to person, place, and time. She appears well-developed and well-nourished. No distress.  HENT:  Head: Normocephalic.  Mouth/Throat: Uvula is midline and oropharynx is clear and moist. No trismus in the jaw. No uvula swelling. No oropharyngeal exudate, posterior oropharyngeal edema, posterior oropharyngeal erythema or tonsillar abscesses.     Eyes: Conjunctivae and EOM are normal.  Neck: Normal range of motion.  Cardiovascular: Normal rate, regular rhythm and intact distal pulses.   Pulmonary/Chest: Effort normal. No stridor.   Abdominal: Soft. Bowel sounds are normal. She exhibits no distension and no mass. There is no tenderness. There is no rebound and no guarding.  Genitourinary:  No CVA tenderness to percussion bilaterally  Musculoskeletal: Normal range of motion.  Lymphadenopathy:    She has no cervical adenopathy.  Neurological: She is alert and oriented to person, place, and time.  No point tenderness to percussion of lumbar spinal processes.  No TTP or paraspinal muscular spasm. Strength is 5 out of 5 to bilateral lower extremities at hip and knee; extensor hallucis longus 5 out of 5. Ankle strength 5 out of 5, no clonus, neurovascularly intact. No saddle anaesthesia. Patellar reflexes are 2+ bilaterally.    Psychiatric: She has a normal mood and affect.  Nursing note and vitals reviewed.   ED Course  Procedures (including critical care time) Labs Review Labs Reviewed - No data to display  Imaging Review No results found. I have personally reviewed and evaluated these images and lab results as part of my medical decision-making.   EKG Interpretation None      MDM   Final diagnoses:  Bilateral low back pain without sciatica  Sciatica, unspecified laterality    Filed Vitals:   09/05/15 1640 09/05/15 1652 09/05/15 1839  BP: 164/105 163/123 145/90  Pulse: 130 124 108  Temp: 98 F (36.7 C)  97.8 F (36.6 C)  TempSrc: Oral  Oral  Resp: 18 18 16   SpO2: 100% 100% 100%    Medications  methocarbamol (ROBAXIN) tablet 1,000 mg (1,000 mg Oral Given 09/05/15 1733)  sodium chloride 0.9 % bolus 1,000 mL (0 mLs Intravenous Stopped 09/05/15 1950)  HYDROmorphone (DILAUDID) injection 0.5 mg (0.5 mg Intravenous Given 09/05/15 1733)  sodium chloride 0.9 % bolus 1,000 mL (1,000 mLs Intravenous New Bag/Given 09/05/15 1950)  ketorolac (TORADOL) 30 MG/ML injection 30 mg (30 mg Intravenous Given 09/05/15 1834)  HYDROmorphone (DILAUDID) injection 0.5 mg (0.5 mg Intravenous Given 09/05/15 2144)  ondansetron (ZOFRAN)  injection 4 mg (4 mg Intravenous Given 09/05/15 2140)    Nicole Figueroa is 40 y.o. female presenting with Severe low back pain worsening over the last several days, onset several months ago, neurologic exam is nonfocal. MRI obtained in the last 48 hours without acute abnormality. Patient is significant tachycardic with a heart rate of 1:30, she is also hypertensive with a blood pressure 164/105, she does take hydrochlorothiazide for her blood pressure a think that this is largely related to pain, patient will be given Dilaudid and Zofran, IV fluid.  Patient remains extremely uncomfortable, pain is severe even after Dilaudid, will give Toradol and reassess, she also remains tachycardic. Abdominal exam is benign however given the severity of her pain, will obtain CT abdomen pelvis to evaluate for kidney stone.  Stone protocol CT without acute abnormality to explain  this patient's pain, she remains in severe pain, urinalysis is without signs of infection, she has no CVA tenderness to suggest pyelonephritis.  Patient states he feels uncomfortable going home, will try to admit for pain control.  Discussed case with Dr. Blaine Hamper who has kindly accepted admission.    Monico Blitz, PA-C 09/05/15 2155  Forde Dandy, MD 09/05/15 [phone]

## 2015-09-05 NOTE — ED Notes (Signed)
Patient ambulatory to restroom  ?

## 2015-09-05 NOTE — H&P (Addendum)
History and Physical    Nicole Figueroa XTG:[id number] DOB: 20-Jul-1975 DOA: 09/05/2015  Referring MD/NP/PA:   PCP: Tawanna Solo, MD   Patient coming from:  The patient is coming from home.  At baseline, pt is independent for her ADL.   Chief Complaint: Bilateral lower back pain, urinary urgency, hematuria  HPI: Nicole Figueroa is a 40 y.o. female with medical history significant of hypertension, depression, rheumatoid arthritis, TMJ dysfunction, recent D&C due to vaginal bleeding 06/2015, who presents with bilateral lower back pain, urinary urgency and hematuria.  Patient reports that she has been having bilateral lower back pain for more that one month, which has worsened in the past several days. The pain is located in the paraspinal areas in the lower back bilaterally, constant, 10 out of 10 in severity, nonradiating. No alarming symptoms, such as urinary incontinence, loss of control of bowel movement, weakness or numbness in legs. Patient does not have fever or chills. She denies any injury to the lower back, did not lift heavy object recently. She was seen by PCP and had MRI of lumbar spin on 09/03/15, which showed early disc and facet degeneration and L4-5 mild left subarticular recess narrowing, no definitive impingement. She took ibuprofen at home without help. Pt also states that she has mild hematuria and urinary urgency, but no dysuria or burning on urination. Patient has nausea, but no vomiting, abdominal pain or diarrhea. No unilateral weakness, vision change or hearing loss. Patient states that she had a D&C 06/2015 due to vaginal bleeding, which has completely resolved after the procedure.  ED Course: pt was found to have a negative pregnancy test, urinalysis with small moderate leukocyte, temperature normal, tachycardia, electrolytes renal function okay. CT per renal stone protocol is negative for acute abnormalities. Patient received 2 doses of Dilaudid and 1 dose of Toradol in the  emergency room, still has severe pain, 10 out of 10 in severity. Pt is placed on telemetry bed for observation.  Review of Systems:   General: no fevers, chills, no changes in body weight, has fatigue HEENT: no blurry vision, hearing changes or sore throat Pulm: no dyspnea, coughing, wheezing CV: no chest pain, no palpitations Abd: has nausea, no vomiting, abdominal pain, diarrhea, constipation GU: no dysuria, burning on urination, increased urinary frequency, has hematuria and urgency. Ext: no leg edema Neuro: no unilateral weakness, numbness, or tingling, no vision change or hearing loss Skin: no rash MSK: has lower back pain Heme: No easy bruising.  Travel history: No recent long distant travel.  Allergy: No Known Allergies  Past Medical History  Diagnosis Date  . Depression     when had a loss in family--fam hx of depression  . Abnormal uterine bleeding 2016, 06/2015  . Hypertension   . RA [place]rheumatoid [place]) ([place]) 2013    --trying to control with diet--followed by PCP  . Anemia     taking iron supplement  . Shortness of breath dyspnea     w/ activity ? d/t anemia  . TMJ dysfunction     bilateral  . Wears glasses     Past Surgical History  Procedure Laterality Date  . Broken foot Right 08/2014  . Hysteroscopy w/d&c N/A 07/03/2015    Procedure: DILATATION AND CURETTAGE /HYSTEROSCOPY;  Surgeon: Nunzio Cobbs, MD;  Location: [facility];  Service: Gynecology;  Laterality: N/A;    Social History:  reports that she quit smoking about 9 years ago. Her smoking use included Cigarettes. She smoked  2.00 packs per day. She does not have any smokeless tobacco history on file. She reports that she drinks about 1.2 - 1.8 oz of alcohol per week. She reports that she does not use illicit drugs.  Family History:  Family History  Problem Relation Age of Onset  . Heart disease Father     dec age 17  . Hypertension Mother   . Hypertension Maternal  Grandmother   . Stroke Maternal Grandmother   . Cancer Maternal Aunt     dec brain ca--glioblastoma  . Cancer Maternal Grandfather     Dec Glioblastoma  . Cancer Other     Dec Glioblastoma     Prior to Admission medications   Medication Sig Start Date End Date Taking? Authorizing Provider  hydrochlorothiazide (HYDRODIURIL) 25 MG tablet Take 1 tablet by mouth daily. 04/13/15  Yes Historical Provider, MD  ibuprofen (ADVIL,MOTRIN) 800 MG tablet Take 1 tablet (800 mg total) by mouth every 8 (eight) hours as needed. 07/03/15  Yes [place], MD  Magnesium Salicylate Tetrahyd (DOANS EXTRA STRENGTH) 580 MG TABS Take 2 tablets by mouth daily as needed (pain.).   Yes Historical Provider, MD  Multiple Vitamin (MULTIVITAMIN WITH MINERALS) TABS tablet Take 1 tablet by mouth daily.   Yes Historical Provider, MD    Physical Exam: Filed Vitals:   09/05/15 1640 09/05/15 1652 09/05/15 1839  BP: 164/105 163/123 145/90  Pulse: 130 124 108  Temp: 98 F (36.7 C)  97.8 F (36.6 C)  TempSrc: Oral  Oral  Resp: 18 18 16   SpO2: 100% 100% 100%   General: Not in acute distress HEENT:       Eyes: PERRL, EOMI, no scleral icterus.       ENT: No discharge from the ears and nose, no pharynx injection, no tonsillar enlargement.        Neck: No JVD, no bruit, no mass felt. Heme: No neck lymph node enlargement. Cardiac: S1/S2, RRR, No murmurs, No gallops or rubs. Pulm: No rales, wheezing, rhonchi or rubs. Abd: Soft, nondistended, nontender, no rebound pain, no organomegaly, BS present. GU: No hematuria Ext: No pitting leg edema bilaterally. 2+DP/PT pulse bilaterally. Musculoskeletal: has tenderness over paraspinal muscle area bilaterally. Skin: No rashes.  Neuro: Alert, oriented X3, cranial nerves II-XII grossly intact, moves all extremities normally. Muscle strength 5/5 in all extremities, sensation to light touch intact. Knee reflex 1+ bilaterally. Negative Babinski's sign. Psych: Patient is not  psychotic, no suicidal or hemocidal ideation.  Labs on Admission: I have personally reviewed following labs and imaging studies  CBC:  Recent Labs Lab 09/05/15 1710  WBC 7.0  NEUTROABS 5.6  HGB 12.9  HCT 39.5  MCV 83.9  PLT 010   Basic Metabolic Panel:  Recent Labs Lab 09/05/15 1710  NA 138  K 3.6  CL 106  CO2 22  GLUCOSE 111*  BUN 11  CREATININE 0.71  CALCIUM 8.5*   GFR: CrCl cannot be calculated (Unknown ideal weight.). Liver Function Tests: No results for input(s): AST, ALT, ALKPHOS, BILITOT, PROT, ALBUMIN in the last 168 hours. No results for input(s): LIPASE, AMYLASE in the last 168 hours. No results for input(s): AMMONIA in the last 168 hours. Coagulation Profile: No results for input(s): INR, PROTIME in the last 168 hours. Cardiac Enzymes: No results for input(s): CKTOTAL, CKMB, CKMBINDEX, TROPONINI in the last 168 hours. BNP (last 3 results) No results for input(s): PROBNP in the last 8760 hours. HbA1C: No results for input(s): HGBA1C in  the last 72 hours. CBG: No results for input(s): GLUCAP in the last 168 hours. Lipid Profile: No results for input(s): CHOL, HDL, LDLCALC, TRIG, CHOLHDL, LDLDIRECT in the last 72 hours. Thyroid Function Tests: No results for input(s): TSH, T4TOTAL, FREET4, T3FREE, THYROIDAB in the last 72 hours. Anemia Panel: No results for input(s): VITAMINB12, FOLATE, FERRITIN, TIBC, IRON, RETICCTPCT in the last 72 hours. Urine analysis:    Component Value Date/Time   COLORURINE YELLOW 09/05/2015 1950   APPEARANCEUR CLOUDY* 09/05/2015 1950   LABSPEC 1.009 09/05/2015 1950   PHURINE 5.5 09/05/2015 1950   GLUCOSEU NEGATIVE 09/05/2015 1950   HGBUR NEGATIVE 09/05/2015 1950   BILIRUBINUR NEGATIVE 09/05/2015 1950   KETONESUR NEGATIVE 09/05/2015 1950   PROTEINUR NEGATIVE 09/05/2015 1950   NITRITE NEGATIVE 09/05/2015 1950   LEUKOCYTESUR SMALL* 09/05/2015 1950   Sepsis Labs: @LABRCNTIP (procalcitonin:4,lacticidven:4) )No results  found for this or any previous visit (from the past 240 hour(s)).   Radiological Exams on Admission: Ct Renal Stone Study  09/05/2015  CLINICAL DATA:  40 year old female with flank and lower back pain. EXAM: CT ABDOMEN AND PELVIS WITHOUT CONTRAST TECHNIQUE: Multidetector CT imaging of the abdomen and pelvis was performed following the standard protocol without IV contrast. COMPARISON:  None. FINDINGS: Evaluation of this exam is limited in the absence of intravenous contrast. The visualized lung bases are clear. No intra-abdominal free air or free fluid. Diffuse fatty infiltration of the liver with focal area of sparing adjacent to the gallbladder fossa. The gallbladder, pancreas, spleen, adrenal glands, kidneys, visualized ureters, and urinary bladder appear unremarkable. The uterus is anteverted and grossly unremarkable. There is a 2 cm left ovarian hypodensity which may represent normal ovarian tissue versus a dominant follicle/ cyst. Ultrasound may provide better evaluation of the pelvic structures if clinically indicated. There is moderate stool throughout the colon. No evidence of bowel obstruction or active inflammation. Normal appendix. The abdominal aorta and IVC are grossly unremarkable on this noncontrast study. No portal venous gas identified. There is mild haziness of the mesentery with multiple top-normal lymph nodes with a "misty mesentery" appearance. This finding is nonspecific but may be related to underlying inflammatory/infectious etiology. There is no adenopathy. There is a small fat containing umbilical hernia. The osseous structures are intact. IMPRESSION: No acute intra-abdominal pelvic pathology. Specifically there is no hydronephrosis or nephrolithiasis. Small scattered mesenteric lymph nodes, nonspecific. Electronically Signed   By: Anner Crete M.D.   On: 09/05/2015 20:34     EKG:  Not done in ED, will get one.   Assessment/Plan Principal Problem:   Bilateral low back pain  without sciatica Active Problems:   Hematuria   UTI (lower urinary tract infection)   Essential hypertension   Urinary urgency  Bilateral low back pain without sciatica: No alarming symptoms.  MRI of lumbar spin on 6/29/17showed early disc and facet degeneration and L4-5 mild left subarticular recess narrowing, no definitive impingement. Her pain is located in the paraspinal muscle areas, indicating paraspinal muscle pain or spasm. No signs of infection. Patient received 2 doses of Dilaudid and 1 dose of Toradol in the emergency room, still has severe pain, 10 out of 10 in severity, will admit to pain control and obs, also need to treat possible UTI.  -Will place on telemetry bed for observation -When necessary Percocet, Mobic 7.5 mg daily, robaxin when necessary -When necessary Zofran for nausea  Possible UTI: Patient reports hematuria and urinary urgency, urinalysis with small moderate leukocyte, indicating possible UTI. CT per renal stone protocol  is negative. -will give one dose of fosfomycin 3 g 1 -Follow-up urine culture  HTN: bp is elevated 160/105, likely due to pain -continue HCTZ -IV hydralazine prn  DVT ppx: SQ Lovenox Code Status: Full code Family Communication: Yes, patient's wife  at bed side Disposition Plan:  Anticipate discharge back to previous home environment Consults called:  none Admission status: Obs / tele  Date of Service 09/05/2015    Ivor Costa [facility] Pager [phone]  If 7PM-7AM, please contact night-coverage [email or web address] Password [facility] 09/05/2015, 10:27 PM

## 2015-09-06 DIAGNOSIS — R319 Hematuria, unspecified: Secondary | ICD-10-CM

## 2015-09-06 DIAGNOSIS — I10 Essential (primary) hypertension: Secondary | ICD-10-CM

## 2015-09-06 DIAGNOSIS — R3915 Urgency of urination: Secondary | ICD-10-CM

## 2015-09-06 DIAGNOSIS — M545 Low back pain: Secondary | ICD-10-CM

## 2015-09-06 LAB — BASIC METABOLIC PANEL
Anion gap: 5 (ref 5–15)
BUN: 9 mg/dL (ref 6–20)
CO2: 25 mmol/L (ref 22–32)
Calcium: 7.8 mg/dL — ABNORMAL LOW (ref 8.9–10.3)
Chloride: 108 mmol/L (ref 101–111)
Creatinine, Ser: 0.64 mg/dL (ref 0.44–1.00)
GFR calc Af Amer: >60 mL/min (ref >60)
GFR calc non Af Amer: >60 mL/min (ref >60)
Glucose, Bld: 97 mg/dL (ref 65–99)
Potassium: 3.7 mmol/L (ref 3.5–5.1)
Sodium: 138 mmol/L (ref 135–145)

## 2015-09-06 LAB — CBC
HCT: 34.4 % — ABNORMAL LOW (ref 36.0–46.0)
Hemoglobin: 11.2 g/dL — ABNORMAL LOW (ref 12.0–15.0)
MCH: 27.5 pg (ref 26.0–34.0)
MCHC: 32.6 g/dL (ref 30.0–36.0)
MCV: 84.5 fL (ref 78.0–100.0)
Platelets: 274 10*3/uL (ref 150–400)
RBC: 4.07 MIL/uL (ref 3.87–5.11)
RDW: 14.3 % (ref 11.5–15.5)
WBC: 5 10*3/uL (ref 4.0–10.5)

## 2015-09-06 MED ORDER — MELOXICAM 7.5 MG PO TABS: 7.5000 mg | ORAL_TABLET | Freq: Two times a day (BID) | ORAL | Status: DC

## 2015-09-06 MED ORDER — METHOCARBAMOL 750 MG PO TABS: 750.0000 mg | ORAL_TABLET | Freq: Three times a day (TID) | ORAL | Status: DC | PRN

## 2015-09-06 NOTE — Discharge Instructions (Signed)
Back Exercises The following exercises strengthen the muscles that help to support the back. They also help to keep the lower back flexible. Doing these exercises can help to prevent back pain or lessen existing pain. If you have back pain or discomfort, try doing these exercises 2-3 times each day or as told by your health care provider. When the pain goes away, do them once each day, but increase the number of times that you repeat the steps for each exercise (do more repetitions). If you do not have back pain or discomfort, do these exercises once each day or as told by your health care provider. EXERCISES Single Knee to Chest Repeat these steps 3-5 times for each leg:  Lie on your back on a firm bed or the floor with your legs extended.  Bring one knee to your chest. Your other leg should stay extended and in contact with the floor.  Hold your knee in place by grabbing your knee or thigh.  Pull on your knee until you feel a gentle stretch in your lower back.  Hold the stretch for 10-30 seconds.  Slowly release and straighten your leg. Pelvic Tilt Repeat these steps 5-10 times:  Lie on your back on a firm bed or the floor with your legs extended.  Bend your knees so they are pointing toward the ceiling and your feet are flat on the floor.  Tighten your lower abdominal muscles to press your lower back against the floor. This motion will tilt your pelvis so your tailbone points up toward the ceiling instead of pointing to your feet or the floor.  With gentle tension and even breathing, hold this position for 5-10 seconds. Cat-Cow Repeat these steps until your lower back becomes more flexible:  Get into a hands-and-knees position on a firm surface. Keep your hands under your shoulders, and keep your knees under your hips. You may place padding under your knees for comfort.  Let your head hang down, and point your tailbone toward the floor so your lower back becomes rounded like the  back of a cat.  Hold this position for 5 seconds.  Slowly lift your head and point your tailbone up toward the ceiling so your back forms a sagging arch like the back of a cow.  Hold this position for 5 seconds. Press-Ups Repeat these steps 5-10 times:  Lie on your abdomen (face-down) on the floor.  Place your palms near your head, about shoulder-width apart.  While you keep your back as relaxed as possible and keep your hips on the floor, slowly straighten your arms to raise the top half of your body and lift your shoulders. Do not use your back muscles to raise your upper torso. You may adjust the placement of your hands to make yourself more comfortable.  Hold this position for 5 seconds while you keep your back relaxed.  Slowly return to lying flat on the floor. Bridges Repeat these steps 10 times: 1. Lie on your back on a firm surface. 2. Bend your knees so they are pointing toward the ceiling and your feet are flat on the floor. 3. Tighten your buttocks muscles and lift your buttocks off of the floor until your waist is at almost the same height as your knees. You should feel the muscles working in your buttocks and the back of your thighs. If you do not feel these muscles, slide your feet 1-2 inches farther away from your buttocks. 4. Hold this position for 3-5  seconds. 5. Slowly lower your hips to the starting position, and allow your buttocks muscles to relax completely. If this exercise is too easy, try doing it with your arms crossed over your chest. Abdominal Crunches Repeat these steps 5-10 times: 1. Lie on your back on a firm bed or the floor with your legs extended. 2. Bend your knees so they are pointing toward the ceiling and your feet are flat on the floor. 3. Cross your arms over your chest. 4. Tip your chin slightly toward your chest without bending your neck. 5. Tighten your abdominal muscles and slowly raise your trunk (torso) high enough to lift your shoulder  blades a tiny bit off of the floor. Avoid raising your torso higher than that, because it can put too much stress on your low back and it does not help to strengthen your abdominal muscles. 6. Slowly return to your starting position. Back Lifts Repeat these steps 5-10 times: 1. Lie on your abdomen (face-down) with your arms at your sides, and rest your forehead on the floor. 2. Tighten the muscles in your legs and your buttocks. 3. Slowly lift your chest off of the floor while you keep your hips pressed to the floor. Keep the back of your head in line with the curve in your back. Your eyes should be looking at the floor. 4. Hold this position for 3-5 seconds. 5. Slowly return to your starting position. SEEK MEDICAL CARE IF:  Your back pain or discomfort gets much worse when you do an exercise.  Your back pain or discomfort does not lessen within 2 hours after you exercise. If you have any of these problems, stop doing these exercises right away. Do not do them again unless your health care provider says that you can. SEEK IMMEDIATE MEDICAL CARE IF:  You develop sudden, severe back pain. If this happens, stop doing the exercises right away. Do not do them again unless your health care provider says that you can.   This information is not intended to replace advice given to you by your health care provider. Make sure you discuss any questions you have with your health care provider.   Document Released: 03/31/2004 Document Revised: 11/12/2014 Document Reviewed: 04/17/2014 Elsevier Interactive Patient Education 2016 [employer]  Back Pain, Adult Back pain is very common in adults.The cause of back pain is rarely dangerous and the pain often gets better over time.The cause of your back pain may not be known. Some common causes of back pain include:  Strain of the muscles or ligaments supporting the spine.  Wear and tear (degeneration) of the spinal disks.  Arthritis.  Direct injury  to the back. For many people, back pain may return. Since back pain is rarely dangerous, most people can learn to manage this condition on their own. HOME CARE INSTRUCTIONS Watch your back pain for any changes. The following actions may help to lessen any discomfort you are feeling:  Remain active. It is stressful on your back to sit or stand in one place for long periods of time. Do not sit, drive, or stand in one place for more than 30 minutes at a time. Take short walks on even surfaces as soon as you are able.Try to increase the length of time you walk each day.  Exercise regularly as directed by your health care provider. Exercise helps your back heal faster. It also helps avoid future injury by keeping your muscles strong and flexible.  Do not stay in  bed.Resting more than 1-2 days can delay your recovery.  Pay attention to your body when you bend and lift. The most comfortable positions are those that put less stress on your recovering back. Always use proper lifting techniques, including:  Bending your knees.  Keeping the load close to your body.  Avoiding twisting.  Find a comfortable position to sleep. Use a firm mattress and lie on your side with your knees slightly bent. If you lie on your back, put a pillow under your knees.  Avoid feeling anxious or stressed.Stress increases muscle tension and can worsen back pain.It is important to recognize when you are anxious or stressed and learn ways to manage it, such as with exercise.  Take medicines only as directed by your health care provider. Over-the-counter medicines to reduce pain and inflammation are often the most helpful.Your health care provider may prescribe muscle relaxant drugs.These medicines help dull your pain so you can more quickly return to your normal activities and healthy exercise.  Apply ice to the injured area:  Put ice in a plastic bag.  Place a towel between your skin and the bag.  Leave the ice on  for 20 minutes, 2-3 times a day for the first 2-3 days. After that, ice and heat may be alternated to reduce pain and spasms.  Maintain a healthy weight. Excess weight puts extra stress on your back and makes it difficult to maintain good posture. SEEK MEDICAL CARE IF:  You have pain that is not relieved with rest or medicine.  You have increasing pain going down into the legs or buttocks.  You have pain that does not improve in one week.  You have night pain.  You lose weight.  You have a fever or chills. SEEK IMMEDIATE MEDICAL CARE IF:   You develop new bowel or bladder control problems.  You have unusual weakness or numbness in your arms or legs.  You develop nausea or vomiting.  You develop abdominal pain.  You feel faint.   This information is not intended to replace advice given to you by your health care provider. Make sure you discuss any questions you have with your health care provider.   Document Released: 02/21/2005 Document Revised: 03/14/2014 Document Reviewed: 06/25/2013 Elsevier Interactive Patient Education [employer].

## 2015-09-06 NOTE — Discharge Summary (Signed)
Physician Discharge Summary  Mitra Duling CBJ:[id number] DOB: 1975/09/02 DOA: 09/05/2015  PCP: Tawanna Solo, MD  Admit date: 09/05/2015 Discharge date: 09/06/2015  Time spent: 35 minutes  Recommendations for Outpatient Follow-up:  Repeat BMET to follow electrolytes and renal function  Reassess BP and adjust as needed   Discharge Diagnoses:    Bilateral low back pain without sciatica   Hematuria   UTI (lower urinary tract infection)   Essential hypertension   Depression   Discharge Condition: stable and improved. Discharge home with instructions to follow up with PCP and with orthopedic service.  Diet recommendation: heart healthy diet   Filed Weights   09/05/15 2305  Weight: 95.664 kg (210 lb 14.4 oz)    History of present illness:  As per H&P written by Dr. Blaine Hamper on 09/05/15 40 y.o. female with medical history significant of hypertension, depression, rheumatoid arthritis, TMJ dysfunction, recent D&C due to vaginal bleeding 06/2015, who presents with bilateral lower back pain, urinary urgency and hematuria. Patient reports that she has been having bilateral lower back pain for more that one month, which has worsened in the past several days. The pain is located in the paraspinal areas in the lower back bilaterally, constant, 10 out of 10 in severity, nonradiating. No alarming symptoms, such as urinary incontinence, loss of control of bowel movement, weakness or numbness in legs. Patient does not have fever or chills. She denies any injury to the lower back and did not lift heavy object recently. She was seen by PCP and had MRI of lumbar spine on 09/03/15, which showed early disc and facet degeneration and L4-5 mild left subarticular recess narrowing, no definitive impingement. She took ibuprofen at home without help. Pt also states that she has mild hematuria and urinary urgency, but no dysuria or burning on urination.   Hospital Course:  1-Bilateral lower back pain w/o sciatica   -improved with analgesics -will discharge on mobic BID and robaxin -advise to use cold compresses and to keep herself active -patient will follow up with PCP and orthopedic service at discharge   2-presumed UTI -no dysuria, no fever and normal WBC's -treated with Fosfomycin -final urine cx's pending  -patient advise to keep herself well hydrated  3-essential HTN -stable -will continue home antihypertensive regimen and advise to follow heart healthy diet   4-tachycardia: -due to pain and mild dehydration -resolved with IVF's and supportive care/analgesia -no abnormalities on telemetry   5-hx of depression -no SI or hallucinations -currently not taking any meds.  Procedures:  See below for x-ray reports   Consultations:  None   Discharge Exam: Filed Vitals:   09/05/15 2305 09/06/15 0542  BP: 154/96 130/86  Pulse: 98 89  Temp: 98.3 F (36.8 C) 98.5 F (36.9 C)  Resp: 20 20    General: afebrile, no CP, no SOB and with improvement in her back pain. Patient is not longer tachycardic and w/o dysuria. Cardiovascular: S1 and S2, no rubs or gallops Respiratory: CTA bilaterally Abd: soft, NT, ND, positive BS Extremities: no edema, no cyanosis   Discharge Instructions   Discharge Instructions    Diet - low sodium heart healthy    Complete by:  As directed      Discharge instructions    Complete by:  As directed   Keep yourself well hydrated Arrange follow up with PCP in 2 weeks Use cold compresses in your back at least three times a day and keep yourself active Follow up with orthopedic service as previously scheduled  Current Discharge Medication List    START taking these medications   Details  meloxicam (MOBIC) 7.5 MG tablet Take 1 tablet (7.5 mg total) by mouth 2 (two) times daily. Qty: 40 tablet, Refills: 0    methocarbamol (ROBAXIN) 750 MG tablet Take 1 tablet (750 mg total) by mouth every 8 (eight) hours as needed for muscle spasms. Qty: 40  tablet, Refills: 0      CONTINUE these medications which have NOT CHANGED   Details  hydrochlorothiazide (HYDRODIURIL) 25 MG tablet Take 1 tablet by mouth daily.    Magnesium Salicylate Tetrahyd (DOANS EXTRA STRENGTH) 580 MG TABS Take 2 tablets by mouth daily as needed (pain.).    Multiple Vitamin (MULTIVITAMIN WITH MINERALS) TABS tablet Take 1 tablet by mouth daily.      STOP taking these medications     ibuprofen (ADVIL,MOTRIN) 800 MG tablet        No Known Allergies Follow-up Information    Follow up with Tawanna Solo, MD. Schedule an appointment as soon as possible for a visit in 2 weeks.   Specialty:  Family Medicine   Contact information:   [address] [place] [address] [phone]       The results of significant diagnostics from this hospitalization (including imaging, microbiology, ancillary and laboratory) are listed below for reference.    Significant Diagnostic Studies: Mr Lumbar Spine Wo Contrast  09/03/2015  CLINICAL DATA:  Chronic low back pain. Unspecified site and sciatica. EXAM: MRI LUMBAR SPINE WITHOUT CONTRAST TECHNIQUE: Multiplanar, multisequence MR imaging of the lumbar spine was performed. No intravenous contrast was administered. COMPARISON:  None. FINDINGS: Segmentation: Standard. Alignment: Physiologic. Vertebrae: No signal abnormality to suggest fracture, discitis, or mass. Spondylotic signal changes in the L3 anterior superior corner. Conus: Extends to the T12 level and appears normal. Paraspinal and retroperitoneal structures: Negative. Disc levels: T12- L1: Unremarkable. L1-L2: Unremarkable. L2-L3: Mild left lateral and foraminal disc bulging. Mild ligamentum flavum thickening. No impingement. L3-L4: Early leftward disc bulging. Mild facet marginal spurring. No impingement. L4-L5: Mild leftward disc bulging with small left paracentral protrusion most convincing on sagittal acquisition. Mild facet and ligament hypertrophy on the left.  Mild left subarticular recess narrowing. L5-S1:Tiny posterior disc protrusion without neural contact. Hypoplastic right facet. No impingement. IMPRESSION: 1. Early disc and facet degeneration as described. 2. L4-5 mild left subarticular recess narrowing. No definitive impingement. Electronically Signed   By: Monte Fantasia M.D.   On: 09/03/2015 08:22   Ct Renal Stone Study  09/05/2015  CLINICAL DATA:  40 year old female with flank and lower back pain. EXAM: CT ABDOMEN AND PELVIS WITHOUT CONTRAST TECHNIQUE: Multidetector CT imaging of the abdomen and pelvis was performed following the standard protocol without IV contrast. COMPARISON:  None. FINDINGS: Evaluation of this exam is limited in the absence of intravenous contrast. The visualized lung bases are clear. No intra-abdominal free air or free fluid. Diffuse fatty infiltration of the liver with focal area of sparing adjacent to the gallbladder fossa. The gallbladder, pancreas, spleen, adrenal glands, kidneys, visualized ureters, and urinary bladder appear unremarkable. The uterus is anteverted and grossly unremarkable. There is a 2 cm left ovarian hypodensity which may represent normal ovarian tissue versus a dominant follicle/ cyst. Ultrasound may provide better evaluation of the pelvic structures if clinically indicated. There is moderate stool throughout the colon. No evidence of bowel obstruction or active inflammation. Normal appendix. The abdominal aorta and IVC are grossly unremarkable on this noncontrast study. No portal venous gas identified. There  is mild haziness of the mesentery with multiple top-normal lymph nodes with a "misty mesentery" appearance. This finding is nonspecific but may be related to underlying inflammatory/infectious etiology. There is no adenopathy. There is a small fat containing umbilical hernia. The osseous structures are intact. IMPRESSION: No acute intra-abdominal pelvic pathology. Specifically there is no hydronephrosis or  nephrolithiasis. Small scattered mesenteric lymph nodes, nonspecific. Electronically Signed   By: Anner Crete M.D.   On: 09/05/2015 20:34    Microbiology: No results found for this or any previous visit (from the past 240 hour(s)).   Labs: Basic Metabolic Panel:  Recent Labs Lab 09/05/15 1710 09/06/15 0459  NA 138 138  K 3.6 3.7  CL 106 108  CO2 22 25  GLUCOSE 111* 97  BUN 11 9  CREATININE 0.71 0.64  CALCIUM 8.5* 7.8*   CBC:  Recent Labs Lab 09/05/15 1710 09/06/15 0459  WBC 7.0 5.0  NEUTROABS 5.6  --   HGB 12.9 11.2*  HCT 39.5 34.4*  MCV 83.9 84.5  PLT 313 274   Signed:  Barton Dubois MD.  [facility] 09/06/2015, 11:59 AM

## 2015-09-07 LAB — URINE CULTURE

## 2015-09-25 ENCOUNTER — Telehealth: Payer: Self-pay | Admitting: Obstetrics and Gynecology

## 2015-09-25 MED ORDER — MEDROXYPROGESTERONE ACETATE 10 MG PO TABS: 10.0000 mg | ORAL_TABLET | Freq: Every day | ORAL | Status: DC

## 2015-09-25 NOTE — Telephone Encounter (Addendum)
Patient calls today with c/o vaginal bleeding that started today. She is changing her pad every hour and notices quarter sized clots with changing pad. She states she feels like she may "be blowing it out of proportion because of what happened last time." She had Dilatation and Curettage/ Hysteroscopy on 07/03/15 with Dr. Quincy Simmonds. Patient has not had a cycle since procedure.  Vasectomy for birth control.  Reports lower back pain, hospitalization for back pain control. Negative pregnancy test 09/05/15.  Denies pain with urination.   Office visit advised today, discussed importance of being seen today with her history and the bleeding she is reporting now.  She declines, she is leaving to go out of town tomorrow and asks "is there something we can do to stop this bleeding now?"  Advised will review with Dr. Quincy Simmonds, however, bleeding as she reported to me was emergent and needs evaluation either in office or emergency room.  Patient declines this advice.  Requests to schedule appointment on Monday after she returns from out of town after monitoring her bleeding.  Advised patient to call back or seek immediate medical care if bleeding worsens or soaking through 1 pad/tampon per hour for two hours or if becomes symptomatic with sob, chest pain, fatigued, lightheaded, or weakness.  Patient verbalized understanding. Advised will review with Dr. Quincy Simmonds and call back with any recommendations.

## 2015-09-25 NOTE — Telephone Encounter (Signed)
OK for Provera 10 mg po q day for 10 days.  Keep appointment for Monday for reassessment.  We will want to talk about options for her care.  I agree with bleeding precautions.

## 2015-09-25 NOTE — Telephone Encounter (Signed)
Call to patient with message from Dr. Quincy Simmonds.  She verbalizes understanding of instructions and emergency symptoms and use of Provera.  She will keep appointment for Monday and okay per Dr. Quincy Simmonds for time as scheduled.  Again, discussed emergency precautions and patient agreed to seek care while out of town at closest emergency room if necessary. Patient verbalized understanding and will keep appointment for Monday with Dr. Quincy Simmonds.  Routing to provider for final review. Patient agreeable to disposition. Will close encounter.

## 2015-09-25 NOTE — Telephone Encounter (Signed)
Patient has started bleeding again and would like to speak with a nurse.

## 2015-09-28 ENCOUNTER — Ambulatory Visit (INDEPENDENT_AMBULATORY_CARE_PROVIDER_SITE_OTHER): Payer: Commercial Managed Care - HMO | Admitting: Obstetrics and Gynecology

## 2015-09-28 ENCOUNTER — Encounter: Payer: Self-pay | Admitting: Obstetrics and Gynecology

## 2015-09-28 VITALS — BP 140/90 | HR 100 | Resp 20 | Ht 67.0 in | Wt 206.0 lb

## 2015-09-28 DIAGNOSIS — N921 Excessive and frequent menstruation with irregular cycle: Secondary | ICD-10-CM

## 2015-09-28 LAB — CBC
HCT: 34.5 % — ABNORMAL LOW (ref 35.0–45.0)
Hemoglobin: 11.1 g/dL — ABNORMAL LOW (ref 11.7–15.5)
MCH: 26.4 pg — ABNORMAL LOW (ref 27.0–33.0)
MCHC: 32.2 g/dL (ref 32.0–36.0)
MCV: 82.1 fL (ref 80.0–100.0)
MPV: 9.5 fL (ref 7.5–12.5)
Platelets: 364 10*3/uL (ref 140–400)
RBC: 4.2 MIL/uL (ref 3.80–5.10)
RDW: 16 % — ABNORMAL HIGH (ref 11.0–15.0)
WBC: 6.7 10*3/uL (ref 3.8–10.8)

## 2015-09-28 MED ORDER — MEGESTROL ACETATE 20 MG PO TABS: 40.0000 mg | ORAL_TABLET | Freq: Three times a day (TID) | ORAL | 0 refills | Status: DC

## 2015-09-28 NOTE — Patient Instructions (Signed)
Total Laparoscopic Hysterectomy A total laparoscopic hysterectomy is a minimally invasive surgery to remove your uterus and cervix. This surgery is performed by making several small cuts (incisions) in your abdomen. It can also be done with a thin, lighted tube (laparoscope) inserted into two small incisions in your lower abdomen. Your fallopian tubes and ovaries can be removed (bilateral salpingo-oophorectomy) during this surgery as well.Benefits of minimally invasive surgery include:  Less pain.  Less risk of blood loss.  Less risk of infection.  Quicker return to normal activities. LET [facility] CARE PROVIDER KNOW ABOUT:  Any allergies you have.  All medicines you are taking, including vitamins, herbs, eye drops, creams, and over-the-counter medicines.  Previous problems you or members of your family have had with the use of anesthetics.  Any blood disorders you have.  Previous surgeries you have had.  Medical conditions you have. RISKS AND COMPLICATIONS  Generally, this is a safe procedure. However, as with any procedure, complications can occur. Possible complications include:  Bleeding.  Blood clots in the legs or lung.  Infection.  Injury to surrounding organs.  Problems with anesthesia.  Early menopause symptoms (hot flashes, night sweats, insomnia).  Risk of conversion to an open abdominal incision. BEFORE THE PROCEDURE  Ask your health care provider about changing or stopping your regular medicines.  Do not take aspirin or blood thinners (anticoagulants) for 1 week before the surgery or as told by your health care provider.  Do not eat or drink anything for 8 hours before the surgery or as told by your health care provider.  Quit smoking if you smoke.  Arrange for a ride home after surgery and for someone to help you at home during recovery. PROCEDURE   You will be given antibiotic medicine.  An IV tube will be placed in your arm. You will be given  medicine to make you sleep (general anesthetic).  A gas (carbon dioxide) will be used to inflate your abdomen. This will allow your surgeon to look inside your abdomen, perform your surgery, and treat any other problems found if necessary.  Three or four small incisions (often less than 1/2 inch) will be made in your abdomen. One of these incisions will be made in the area of your belly button (navel). The laparoscope will be inserted into the incision. Your surgeon will look through the laparoscope while doing your procedure.  Other surgical instruments will be inserted through the other incisions.  Your uterus may be removed through your vagina or cut into small pieces and removed through the small incisions.  Your incisions will be closed. AFTER THE PROCEDURE  The gas will be released from inside your abdomen.  You will be taken to the recovery area where a nurse will watch and check your progress. Once you are awake, stable, and taking fluids well, without other problems, you will return to your room or be allowed to go home.  There is usually minimal discomfort following the surgery because the incisions are so small.  You will be given pain medicine while you are in the hospital and for when you go home.   This information is not intended to replace advice given to you by your health care provider. Make sure you discuss any questions you have with your health care provider.   Document Released: 12/19/2006 Document Revised: 10/24/2012 Document Reviewed: 09/11/2012 Elsevier Interactive Patient Education [employer].

## 2015-09-28 NOTE — Progress Notes (Signed)
GYNECOLOGY  VISIT   HPI: 40 y.o.   Married  Caucasian  female   [phone] with Patient's last menstrual period was 09/24/2015.   here for  Vaginal bleeding since Thursday night. Changed pad q 1/2 hour to 1 hour. Also used super tampon and pad at same time. A lot of clots.    Has skipped her cycle since having surgery. Recent hysteroscopy with dilation and curettage for menorrhagia with prolonged bleeding 07/03/15. Final pathology - secretory endometrial polyp.   Started Provera 10 mg for 10 days. Bleeding is now reduced from what it was.   No dizziness or lightheadedness.  Started taking her iron pills again.  Last Hgb 11.2 on 09/06/15.   TSH 1.99 on 07/01/15.   Some lower back cramping.  No significant pain during menses.  Declines taking birth control pills, IUD, and endometrial ablation.  Interested in hysterectomy.  Declines future childbearing.   Recent admission to hospital for back pain and pain control.  This is not related to her menses.  Had MRI and CT scan.  NO renal stones.  Small scattered mesenteric lymph nodes.  Has some disc degeneration. Received lumbar injections for the pain. She will follow up with rheumatology due to her rheumatoid arthritis.   Denies urinary incontinence. Feels she is voiding well.  Was told she was not voiding completely at the hospital for her recent admission.  Had PVR of 400 cc.   Went to retirement party for her dad this weekend.   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   GYNECOLOGIC HISTORY: Patient's last menstrual period was 09/24/2015. Contraception: Vasectomy Menopausal hormone therapy:  n/a Last mammogram:  Never Last pap smear:2 years ago normal per patient. Had one repeat 10 - 12 years ago with repeat normal.         OB History    Gravida Para Term Preterm AB Living   4 4 4     4    SAB TAB Ectopic Multiple Live Births                     Patient Active Problem List   Diagnosis Date Noted  . Hematuria 09/05/2015  . UTI  (lower urinary tract infection) 09/05/2015  . Urinary urgency 09/05/2015  . Bilateral low back pain without sciatica   . Essential hypertension     Past Medical History:  Diagnosis Date  . Abnormal uterine bleeding 2016, 06/2015  . Anemia    taking iron supplement  . Depression    when had a loss in family--fam hx of depression  . Hypertension   . RA (rheumatoid arthritis) ([place]) 2013   --trying to control with diet--followed by PCP  . Shortness of breath dyspnea    w/ activity ? d/t anemia  . TMJ dysfunction    bilateral  . Wears glasses     Past Surgical History:  Procedure Laterality Date  . broken foot Right 08/2014  . HYSTEROSCOPY W/D&C N/A 07/03/2015   Procedure: DILATATION AND CURETTAGE /HYSTEROSCOPY;  Surgeon: Nunzio Cobbs, MD;  Location: [facility];  Service: Gynecology;  Laterality: N/A;    Current Outpatient Prescriptions  Medication Sig Dispense Refill  . hydrochlorothiazide (HYDRODIURIL) 25 MG tablet Take 1 tablet by mouth daily.    . medroxyPROGESTERone (PROVERA) 10 MG tablet Take 1 tablet (10 mg total) by mouth daily. 10 tablet 0  . meloxicam (MOBIC) 7.5 MG tablet Take 1 tablet (7.5 mg total) by mouth 2 (two)  times daily. 40 tablet 0  . methocarbamol (ROBAXIN) 750 MG tablet Take 1 tablet (750 mg total) by mouth every 8 (eight) hours as needed for muscle spasms. 40 tablet 0  . Multiple Vitamin (MULTIVITAMIN WITH MINERALS) TABS tablet Take 1 tablet by mouth daily.    . traMADol (ULTRAM) 50 MG tablet TAKE 1 TABLET BY MOUTH EVERY 8-12 HOURS AS NEEDED FOR PAIN  0   No current facility-administered medications for this visit.      ALLERGIES: Review of patient's allergies indicates no known allergies.  Family History  Problem Relation Age of Onset  . Heart disease Father     dec age 47  . Hypertension Mother   . Hypertension Maternal Grandmother   . Stroke Maternal Grandmother   . Cancer Maternal Aunt     dec brain ca--glioblastoma   . Cancer Maternal Grandfather     Dec Glioblastoma  . Cancer Other     Dec Glioblastoma    Social History   Social History  . Marital status: Married    Spouse name: N/A  . Number of children: N/A  . Years of education: N/A   Occupational History  . Not on file.   Social History Main Topics  . Smoking status: Former Smoker    Packs/day: 2.00    Types: Cigarettes    Quit date: 03/07/2006  . Smokeless tobacco: Not on file  . Alcohol use 1.2 - 1.8 oz/week    2 - 3 Standard drinks or equivalent per week     Comment: 3-5 glasses of wine/week of occ. marguerita  . Drug use: No  . Sexual activity: Yes    Partners: Male    Birth control/ protection: Other-see comments     Comment: husband with vasectomy   Other Topics Concern  . Not on file   Social History Narrative  . No narrative on file    ROS:  Pertinent items are noted in HPI.  PHYSICAL EXAMINATION:    BP 140/90 (BP Location: Right Arm, Patient Position: Sitting, Cuff Size: Normal)   Pulse 100   Resp 20   Ht 5' 7"  (1.702 m)   Wt 206 lb (93.4 kg)   LMP 09/24/2015   BMI 32.26 kg/m     General appearance: alert, cooperative and appears stated age    Pelvic: External genitalia:  no lesions              Urethra:  normal appearing urethra with no masses, tenderness or lesions              Bartholins and Skenes: normal                 Vagina: normal appearing vagina with normal color and discharge, no lesions.  Active menstrual flow.               Cervix: no lesions         Bimanual Exam:  Uterus:  normal size, contour, position, consistency, mobility, non-tender              Adnexa: normal adnexa and no mass, fullness, tenderness            Chaperone was present for exam.  ASSESSMENT  Menorrhagia with irregular menses.  Status post hysteroscopy with dilation and curettage.  Benign endometrial polyp.  Declines future childbearing.   PLAN  Check CBC now.  Stop Provera and start Megace 40 mg po tid for  20 days.   Discussion of  laparoscopic hysterectomy with bilateral salpingectomy (and cystoscopy).  Discussed ovarian removal only if pathology noted at the time of surgery.  Risks, benefits, and alternatives.  Risks include but are not limited to bleeding, infection, damage to surrounding organs, pneumonia, reaction to anesthesia, DVT, PE, death, need for reoperation, hernia formation, vaginal cuff dehiscence, and need for reoperation.  Patient wishes to proceed forward with precerting with insurance company.   _25______ minutes face to face time of which over 50% was spent in counseling.   After visit summary to patient.

## 2015-09-29 ENCOUNTER — Telehealth: Payer: Self-pay | Admitting: *Deleted

## 2015-09-29 NOTE — Telephone Encounter (Signed)
Call to patient. Surgery date options discussed. Patient requests first available date in September. Will proceed with scheduling and call patient back to confirm.

## 2015-09-30 NOTE — Telephone Encounter (Signed)
Call to patient. Confirmed surgery is scheduled for 11-17-15 at 0730 at [facility]. Surgery instruction sheet reviewed and printed copy mailed to patient, see copy scanned to chart.   Routing to provider for final review. Patient agreeable to disposition. Will close encounter.

## 2015-10-01 ENCOUNTER — Telehealth: Payer: Self-pay | Admitting: Emergency Medicine

## 2015-10-01 NOTE — Telephone Encounter (Signed)
Spoke with patient. She is given message from Dr. Quincy Simmonds and verbalized understanding of results. She reports that her bleeding has decreased and she is feeling improved. She agrees to instructions and will start Megace BID. She will call back with any concerns as needed.  Routing to provider for final review. Patient agreeable to disposition. Will close encounter.

## 2015-10-01 NOTE — Telephone Encounter (Signed)
-----   Message from Nunzio Cobbs, MD sent at 09/30/2015  6:02 AM EDT ----- Results to patient through My Chart. "Nicole Figueroa,   Your hemoglobin looks good.  It is just a little below normal. Continue with your iron.  The nurse will call you to check on your bleeding.   Josefa Half, MD"  If the patient's bleeding has decreased, she can reduce her Megace to twice daily.  She is scheduled for hysterectomy for September, and I will likely continue her on the Megace until then.   Cc- Marisa Sprinkles

## 2015-10-19 ENCOUNTER — Other Ambulatory Visit: Payer: Self-pay | Admitting: Obstetrics and Gynecology

## 2015-10-19 NOTE — Telephone Encounter (Signed)
Medication refill request: Megace  Last AEX:  None Last OV: 09-28-15 Next AEX: not scheduled  Last MMG (if hormonal medication request): none on file Refill authorized: please advise

## 2015-10-26 NOTE — Progress Notes (Signed)
Opened in error

## 2015-10-28 ENCOUNTER — Ambulatory Visit (INDEPENDENT_AMBULATORY_CARE_PROVIDER_SITE_OTHER): Payer: Commercial Managed Care - HMO | Admitting: Obstetrics and Gynecology

## 2015-10-28 ENCOUNTER — Encounter: Payer: Self-pay | Admitting: Obstetrics and Gynecology

## 2015-10-28 ENCOUNTER — Telehealth: Payer: Self-pay | Admitting: Obstetrics and Gynecology

## 2015-10-28 VITALS — BP 130/82 | HR 88 | Ht 67.0 in | Wt 203.0 lb

## 2015-10-28 DIAGNOSIS — N921 Excessive and frequent menstruation with irregular cycle: Secondary | ICD-10-CM

## 2015-10-28 NOTE — Progress Notes (Signed)
GYNECOLOGY  VISIT   HPI: 40 y.o.   Married  Caucasian  female   [phone] with Patient's last menstrual period was 10/27/2015 (exact date).   here for surgical consult.    Husband present for entire visit today.   Recent hysteroscopy with dilation and curettage for menorrhagia with prolonged bleeding 07/03/15. Final pathology - secretory endometrial polyp.   Prior ultrasound on 07/01/15 showing EMS 20 mm and no uterine fibroids.  Left ovary with 2.3 cm CL cyst and right ovary normal.   Recurrent bleeding currently treated with Megace 40 mg po bid.  Has bloody discharge with wiping. Missed a dose of Megace and had increased bleeding.   Last Hgb 11.1 on 09/28/15.  TSH 1.99 on 07/01/15.   Some lower back cramping.  No significant pain during menses.  Declines taking birth control pills, IUD, and endometrial ablation.  Interested in hysterectomy.  Declines future childbearing.   Recent admission to hospital for back pain and pain control.  This is not related to her menses.  Had MRI and CT scan.  No renal stones.  Small scattered mesenteric lymph nodes.  Has some disc degeneration. Received lumbar injections for the pain. She will follow up with rheumatology due to her rheumatoid arthritis.   Denies urinary incontinence. Feels she is voiding well.  Was told she was not voiding completely at the hospital for her recent admission.  Had PVR of 400 cc.   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   GYNECOLOGIC HISTORY: Patient's last menstrual period was 10/27/2015 (exact date). Contraception:  Vasectomy Menopausal hormone therapy:  n/a Last mammogram:  NEVER Last pap smear:   2015 normal per patient.         OB History    Gravida Para Term Preterm AB Living   4 4 4     4    SAB TAB Ectopic Multiple Live Births                     Patient Active Problem List   Diagnosis Date Noted  . Hematuria 09/05/2015  . UTI (lower urinary tract infection) 09/05/2015  . Urinary urgency  09/05/2015  . Bilateral low back pain without sciatica   . Essential hypertension     Past Medical History:  Diagnosis Date  . Abnormal uterine bleeding 2016, 06/2015  . Anemia    taking iron supplement  . Depression    when had a loss in family--fam hx of depression  . Hypertension   . RA (rheumatoid arthritis) ([place]) 2013   --trying to control with diet--followed by PCP  . Shortness of breath dyspnea    w/ activity ? d/t anemia  . TMJ dysfunction    bilateral  . Wears glasses     Past Surgical History:  Procedure Laterality Date  . broken foot Right 08/2014  . HYSTEROSCOPY W/D&C N/A 07/03/2015   Procedure: DILATATION AND CURETTAGE /HYSTEROSCOPY;  Surgeon: Nunzio Cobbs, MD;  Location: [facility];  Service: Gynecology;  Laterality: N/A;    Current Outpatient Prescriptions  Medication Sig Dispense Refill  . hydrochlorothiazide (HYDRODIURIL) 25 MG tablet Take 1 tablet by mouth daily.    . megestrol (MEGACE) 20 MG tablet Take 2 tablets (40 mg total) by mouth 2 (two) times daily. 120 tablet 0  . Multiple Vitamin (MULTIVITAMIN WITH MINERALS) TABS tablet Take 1 tablet by mouth daily.     No current facility-administered medications for this visit.  ALLERGIES: Review of patient's allergies indicates no known allergies.  Family History  Problem Relation Age of Onset  . Heart disease Father     dec age 40  . Hypertension Mother   . Hypertension Maternal Grandmother   . Stroke Maternal Grandmother   . Cancer Maternal Grandfather     Dec Glioblastoma  . Cancer Other     Dec Glioblastoma  . Cancer Maternal Aunt     dec brain ca--glioblastoma    Social History   Social History  . Marital status: Married    Spouse name: N/A  . Number of children: N/A  . Years of education: N/A   Occupational History  . Not on file.   Social History Main Topics  . Smoking status: Former Smoker    Packs/day: 2.00    Types: Cigarettes    Quit date:  03/07/2006  . Smokeless tobacco: Never Used  . Alcohol use 1.2 - 1.8 oz/week    2 - 3 Standard drinks or equivalent per week     Comment: 3-5 glasses of wine/week of occ. marguerita  . Drug use: No  . Sexual activity: Yes    Partners: Male    Birth control/ protection: Other-see comments     Comment: husband with vasectomy   Other Topics Concern  . Not on file   Social History Narrative  . No narrative on file    ROS:  Pertinent items are noted in HPI.  PHYSICAL EXAMINATION:    BP 130/82 (BP Location: Right Arm, Patient Position: Sitting, Cuff Size: Normal)   Pulse 88   Ht 5' 7"  (1.702 m)   Wt 203 lb (92.1 kg)   LMP 10/27/2015 (Exact Date)   BMI 31.79 kg/m     General appearance: alert, cooperative and appears stated age Head: Normocephalic, without obvious abnormality, atraumatic Neck: no adenopathy, supple, symmetrical, trachea midline and thyroid normal to inspection and palpation Lungs: clear to auscultation bilaterally Heart: regular rate and rhythm Abdomen: soft, non-tender, no masses,  no organomegaly Extremities: extremities normal, atraumatic, no cyanosis or edema Skin: Skin color, texture, turgor normal. No rashes or lesions Lymph nodes: Cervical, supraclavicular, and axillary nodes normal. No abnormal inguinal nodes palpated Neurologic: Grossly normal  Pelvic: External genitalia:  no lesions              Urethra:  normal appearing urethra with no masses, tenderness or lesions              Bartholins and Skenes: normal                 Vagina: normal appearing vagina with normal color and discharge, no lesions.  Menstrual blood noted.               Cervix: no lesions.  Vaginal blood noted.                  Bimanual Exam:  Uterus:  normal size, contour, position, consistency, mobility, non-tender              Adnexa: no mass, fullness, tenderness                Chaperone was present for exam.  ASSESSMENT  Menorrhagia with irregular menses.  Currently on  Megace 40 mg po bid.  Status post hysteroscopy with dilation and curettage.  Benign endometrial polyp.  Declines future childbearing.  RA.  Off medication.   PLAN  Discussion of laparoscopic hysterectomy with bilateral salpingectomy and cystoscopy. Discussed  ovarian removal only if pathology noted at the time of surgery.  Discussed risk of laparotomy.  Risks, benefits, and alternatives discussed with the patient who wishes to proceed. Surgical expectations and recovery discussed.  No bowel prep needed.  Brought in paperwork for her to be absent form work for 6 weeks.    An After Visit Summary was printed and given to the patient.  __25____ minutes face to face time of which over 50% was spent in counseling.

## 2015-10-28 NOTE — Telephone Encounter (Signed)
[employer] paperwork delivered to office, by patient on 10/28/15. Forms completed 10/28/15 and forwarded to Dr Quincy Simmonds for review and signature

## 2015-10-28 NOTE — Patient Instructions (Signed)
I will see you the day of surgery!

## 2015-11-03 NOTE — Telephone Encounter (Signed)
Called patient to advised FMLA forms have been completed. Left Voicemail requesting a call back.

## 2015-11-03 NOTE — Patient Instructions (Signed)
Your procedure is scheduled on:  Tuesday, 9/12  Enter through the Main Entrance of [facility] at: 6 am  Pick up the phone at the desk and dial [phone].  Call this number if you have problems the morning of surgery: [phone].  Remember: Do NOT eat or drink (including water) after Midnight Monday, 9/11  Take these medicines the morning of surgery with a SIP OF WATER:  hydrochlorothiazide  Do NOT wear jewelry (body piercing), metal hair clips/bobby pins, make-up, or nail polish. Do NOT wear lotions, powders, or perfumes.  You may wear deoderant. Do NOT shave for 48 hours prior to surgery. Do NOT bring valuables to the hospital. Contacts, dentures, or bridgework may not be worn into surgery.  Leave suitcase in car.  After surgery it may be brought to your room.  For patients admitted to the hospital, checkout time is 11:00 AM the day of discharge.

## 2015-11-04 ENCOUNTER — Encounter (HOSPITAL_COMMUNITY)
Admission: RE | Admit: 2015-11-04 | Discharge: 2015-11-04 | Disposition: A | Discharge status: Home / Self Care | Payer: Commercial Managed Care - HMO | Source: Ambulatory Visit | Attending: Obstetrics and Gynecology | Admitting: Obstetrics and Gynecology

## 2015-11-04 ENCOUNTER — Encounter (HOSPITAL_COMMUNITY): Payer: Self-pay

## 2015-11-04 DIAGNOSIS — Z01812 Encounter for preprocedural laboratory examination: Secondary | ICD-10-CM | POA: Insufficient documentation

## 2015-11-04 DIAGNOSIS — N920 Excessive and frequent menstruation with regular cycle: Secondary | ICD-10-CM | POA: Diagnosis not present

## 2015-11-04 DIAGNOSIS — D649 Anemia, unspecified: Secondary | ICD-10-CM | POA: Diagnosis not present

## 2015-11-04 LAB — CBC
HCT: 36.5 % (ref 36.0–46.0)
Hemoglobin: 11.9 g/dL — ABNORMAL LOW (ref 12.0–15.0)
MCH: 25.4 pg — ABNORMAL LOW (ref 26.0–34.0)
MCHC: 32.6 g/dL (ref 30.0–36.0)
MCV: 78 fL (ref 78.0–100.0)
Platelets: 371 10*3/uL (ref 150–400)
RBC: 4.68 MIL/uL (ref 3.87–5.11)
RDW: 15.3 % (ref 11.5–15.5)
WBC: 9.2 10*3/uL (ref 4.0–10.5)

## 2015-11-04 LAB — BASIC METABOLIC PANEL
Anion gap: 7 (ref 5–15)
BUN: 14 mg/dL (ref 6–20)
CO2: 21 mmol/L — ABNORMAL LOW (ref 22–32)
Calcium: 9.3 mg/dL (ref 8.9–10.3)
Chloride: 109 mmol/L (ref 101–111)
Creatinine, Ser: 0.79 mg/dL (ref 0.44–1.00)
GFR calc Af Amer: >60 mL/min (ref >60)
GFR calc non Af Amer: >60 mL/min (ref >60)
Glucose, Bld: 108 mg/dL — ABNORMAL HIGH (ref 65–99)
Potassium: 3.8 mmol/L (ref 3.5–5.1)
Sodium: 137 mmol/L (ref 135–145)

## 2015-11-16 MED ORDER — DEXTROSE 5 % IV SOLN
2.0000 g | INTRAVENOUS | Status: AC
  Administered 2015-11-17: 2 g via INTRAVENOUS
  Filled 2015-11-16: qty 2

## 2015-11-16 NOTE — H&P (Signed)
GYNECOLOGY  VISIT   HPI: 40 y.o.   Married  Caucasian  female   [phone] with Patient's last menstrual period was 10/27/2015 (exact date).   here for surgical consult.    Husband present for entire visit today.   Recent hysteroscopy with dilation and curettage for menorrhagia with prolonged bleeding 07/03/15. Final pathology - secretory endometrial polyp.   Prior ultrasound on 07/01/15 showing EMS 20 mm and no uterine fibroids.  Left ovary with 2.3 cm CL cyst and right ovary normal.   Recurrent bleeding currently treated with Megace 40 mg po bid.  Has bloody discharge with wiping. Missed a dose of Megace and had increased bleeding.   Last Hgb 11.1 on 09/28/15.  TSH 1.99 on 07/01/15.  Some lower back cramping.  No significant pain during menses.  Declines taking birth control pills, IUD, and endometrial ablation.  Interested in hysterectomy.  Declines future childbearing.   Recent admission to hospital for back pain and pain control. This is not related to her menses.  Had MRI and CT scan.  No renal stones.  Small scattered mesenteric lymph nodes.  Has some disc degeneration. Received lumbar injections for the pain. She will follow up with rheumatology due to her rheumatoid arthritis.   Denies urinary incontinence. Feels she is voiding well.  Was told she was not voiding completely at the hospital for her recent admission. Had PVR of 400 cc.   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   GYNECOLOGIC HISTORY: Patient's last menstrual period was 10/27/2015 (exact date). Contraception:  Vasectomy Menopausal hormone therapy:  n/a Last mammogram:  NEVER Last pap smear:   2015 normal per patient.                 OB History    Gravida Para Term Preterm AB Living   4 4 4     4    SAB TAB Ectopic Multiple Live Births                         Patient Active Problem List   Diagnosis Date Noted  . Hematuria 09/05/2015  . UTI (lower urinary tract infection)  09/05/2015  . Urinary urgency 09/05/2015  . Bilateral low back pain without sciatica   . Essential hypertension         Past Medical History:  Diagnosis Date  . Abnormal uterine bleeding 2016, 06/2015  . Anemia    taking iron supplement  . Depression    when had a loss in family--fam hx of depression  . Hypertension   . RA (rheumatoid arthritis) ([place]) 2013   --trying to control with diet--followed by PCP  . Shortness of breath dyspnea    w/ activity ? d/t anemia  . TMJ dysfunction    bilateral  . Wears glasses          Past Surgical History:  Procedure Laterality Date  . broken foot Right 08/2014  . HYSTEROSCOPY W/D&C N/A 07/03/2015   Procedure: DILATATION AND CURETTAGE /HYSTEROSCOPY;  Surgeon: Nunzio Cobbs, MD;  Location: [facility];  Service: Gynecology;  Laterality: N/A;          Current Outpatient Prescriptions  Medication Sig Dispense Refill  . hydrochlorothiazide (HYDRODIURIL) 25 MG tablet Take 1 tablet by mouth daily.    . megestrol (MEGACE) 20 MG tablet Take 2 tablets (40 mg total) by mouth 2 (two) times daily. 120 tablet 0  . Multiple Vitamin (MULTIVITAMIN WITH MINERALS)  TABS tablet Take 1 tablet by mouth daily.     No current facility-administered medications for this visit.      ALLERGIES: Review of patient's allergies indicates no known allergies.        Family History  Problem Relation Age of Onset  . Heart disease Father     dec age 85  . Hypertension Mother   . Hypertension Maternal Grandmother   . Stroke Maternal Grandmother   . Cancer Maternal Grandfather     Dec Glioblastoma  . Cancer Other     Dec Glioblastoma  . Cancer Maternal Aunt     dec brain ca--glioblastoma    Social History        Social History  . Marital status: Married    Spouse name: N/A  . Number of children: N/A  . Years of education: N/A      Occupational History  . Not on file.          Social History Main Topics  . Smoking status: Former Smoker    Packs/day: 2.00    Types: Cigarettes    Quit date: 03/07/2006  . Smokeless tobacco: Never Used  . Alcohol use 1.2 - 1.8 oz/week    2 - 3 Standard drinks or equivalent per week     Comment: 3-5 glasses of wine/week of occ. marguerita  . Drug use: No  . Sexual activity: Yes    Partners: Male    Birth control/ protection: Other-see comments     Comment: husband with vasectomy       Other Topics Concern  . Not on file      Social History Narrative  . No narrative on file    ROS:  Pertinent items are noted in HPI.  PHYSICAL EXAMINATION:    BP 130/82 (BP Location: Right Arm, Patient Position: Sitting, Cuff Size: Normal)   Pulse 88   Ht 5' 7"  (1.702 m)   Wt 203 lb (92.1 kg)   LMP 10/27/2015 (Exact Date)   BMI 31.79 kg/m     General appearance: alert, cooperative and appears stated age Head: Normocephalic, without obvious abnormality, atraumatic Neck: no adenopathy, supple, symmetrical, trachea midline and thyroid normal to inspection and palpation Lungs: clear to auscultation bilaterally Heart: regular rate and rhythm Abdomen: soft, non-tender, no masses,  no organomegaly Extremities: extremities normal, atraumatic, no cyanosis or edema Skin: Skin color, texture, turgor normal. No rashes or lesions Lymph nodes: Cervical, supraclavicular, and axillary nodes normal. No abnormal inguinal nodes palpated Neurologic: Grossly normal  Pelvic: External genitalia:  no lesions              Urethra:  normal appearing urethra with no masses, tenderness or lesions              Bartholins and Skenes: normal                 Vagina: normal appearing vagina with normal color and discharge, no lesions.  Menstrual blood noted.               Cervix: no lesions.  Vaginal blood noted.                  Bimanual Exam:  Uterus:  normal size, contour, position, consistency, mobility, non-tender               Adnexa: no mass, fullness, tenderness                Chaperone was present for  exam.  ASSESSMENT  Menorrhagia with irregular menses.  Currently on Megace 40 mg po bid.  Status post hysteroscopy with dilation and curettage. Benign endometrial polyp.  Declines future childbearing.  RA.  Off medication.   PLAN  Discussion of laparoscopic hysterectomy with bilateral salpingectomy and cystoscopy. Discussed ovarian removal only if pathology noted at the time of surgery.  Discussed risk of laparotomy.  Risks, benefits, and alternatives discussed with the patient who wishes to proceed. Surgical expectations and recovery discussed.  No bowel prep needed.  Brought in paperwork for her to be absent form work for 6 weeks.    An After Visit Summary was printed and given to the patient.  __25____ minutes face to face time of which over 50% was spent in counseling.      Progress Notes by Nunzio Cobbs, MD at 10/28/2015 10:30 AM   Author: Nunzio Cobbs, MD Author Type: Physician Filed: 10/28/2015 8:30 PM  Note Status: Signed Cosign: Cosign Not Required Encounter Date: 10/28/2015  Editor: Nunzio Cobbs, MD (Physician)  Prior Versions: 1. Lowella Fairy, CMA (Certified Medical Assistant) at 10/26/2015 8:52 AM - Sign at close encounter

## 2015-11-17 ENCOUNTER — Encounter (HOSPITAL_COMMUNITY): Payer: Self-pay

## 2015-11-17 ENCOUNTER — Ambulatory Visit (HOSPITAL_COMMUNITY): Payer: Commercial Managed Care - HMO | Admitting: Anesthesiology

## 2015-11-17 ENCOUNTER — Encounter (HOSPITAL_COMMUNITY)
Admission: RE | Disposition: A | Discharge status: Home / Self Care | Payer: Self-pay | Source: Ambulatory Visit | Attending: Obstetrics and Gynecology

## 2015-11-17 ENCOUNTER — Ambulatory Visit (HOSPITAL_COMMUNITY)
Admission: RE | Admit: 2015-11-17 | Discharge: 2015-11-17 | Disposition: A | Discharge status: Home / Self Care | Payer: Commercial Managed Care - HMO | Source: Ambulatory Visit | Attending: Obstetrics and Gynecology | Admitting: Obstetrics and Gynecology

## 2015-11-17 DIAGNOSIS — N921 Excessive and frequent menstruation with irregular cycle: Secondary | ICD-10-CM | POA: Diagnosis not present

## 2015-11-17 DIAGNOSIS — M069 Rheumatoid arthritis, unspecified: Secondary | ICD-10-CM | POA: Insufficient documentation

## 2015-11-17 DIAGNOSIS — D649 Anemia, unspecified: Secondary | ICD-10-CM | POA: Insufficient documentation

## 2015-11-17 DIAGNOSIS — Z90710 Acquired absence of both cervix and uterus: Secondary | ICD-10-CM | POA: Diagnosis present

## 2015-11-17 DIAGNOSIS — N840 Polyp of corpus uteri: Secondary | ICD-10-CM | POA: Diagnosis not present

## 2015-11-17 DIAGNOSIS — Z87891 Personal history of nicotine dependence: Secondary | ICD-10-CM | POA: Insufficient documentation

## 2015-11-17 DIAGNOSIS — N8312 Corpus luteum cyst of left ovary: Secondary | ICD-10-CM | POA: Diagnosis not present

## 2015-11-17 DIAGNOSIS — I10 Essential (primary) hypertension: Secondary | ICD-10-CM | POA: Insufficient documentation

## 2015-11-17 HISTORY — PX: LAPAROSCOPIC HYSTERECTOMY: SHX1926

## 2015-11-17 HISTORY — PX: LAPAROSCOPIC BILATERAL SALPINGECTOMY: SHX5889

## 2015-11-17 HISTORY — PX: CYSTOSCOPY: SHX5120

## 2015-11-17 LAB — CBC
HCT: 35.4 % — ABNORMAL LOW (ref 36.0–46.0)
Hemoglobin: 11.3 g/dL — ABNORMAL LOW (ref 12.0–15.0)
MCH: 25.1 pg — ABNORMAL LOW (ref 26.0–34.0)
MCHC: 31.9 g/dL (ref 30.0–36.0)
MCV: 78.7 fL (ref 78.0–100.0)
Platelets: 338 10*3/uL (ref 150–400)
RBC: 4.5 MIL/uL (ref 3.87–5.11)
RDW: 15.7 % — ABNORMAL HIGH (ref 11.5–15.5)
WBC: 12.4 10*3/uL — ABNORMAL HIGH (ref 4.0–10.5)

## 2015-11-17 LAB — PREGNANCY, URINE: Preg Test, Ur: NEGATIVE

## 2015-11-17 SURGERY — HYSTERECTOMY, TOTAL, LAPAROSCOPIC
Anesthesia: General | Site: Abdomen

## 2015-11-17 MED ORDER — FENTANYL CITRATE (PF) 250 MCG/5ML IJ SOLN
INTRAMUSCULAR | Status: AC
  Filled 2015-11-17: qty 5

## 2015-11-17 MED ORDER — ROPIVACAINE HCL 5 MG/ML IJ SOLN
INTRAMUSCULAR | Status: AC
  Filled 2015-11-17: qty 30

## 2015-11-17 MED ORDER — ONDANSETRON HCL 4 MG/2ML IJ SOLN: 4.0000 mg | Freq: Four times a day (QID) | INTRAMUSCULAR | Status: DC | PRN

## 2015-11-17 MED ORDER — MENTHOL 3 MG MT LOZG: 1.0000 | LOZENGE | OROMUCOSAL | Status: DC | PRN

## 2015-11-17 MED ORDER — SCOPOLAMINE 1 MG/3DAYS TD PT72
MEDICATED_PATCH | TRANSDERMAL | Status: AC
  Administered 2015-11-17: 1.5 mg via TRANSDERMAL
  Filled 2015-11-17: qty 1

## 2015-11-17 MED ORDER — GLYCOPYRROLATE 0.2 MG/ML IJ SOLN
INTRAMUSCULAR | Status: AC
  Filled 2015-11-17: qty 3

## 2015-11-17 MED ORDER — LACTATED RINGERS IV SOLN: INTRAVENOUS | Status: DC

## 2015-11-17 MED ORDER — LIDOCAINE HCL (CARDIAC) 20 MG/ML IV SOLN
INTRAVENOUS | Status: AC
  Filled 2015-11-17: qty 5

## 2015-11-17 MED ORDER — ROCURONIUM BROMIDE 100 MG/10ML IV SOLN
INTRAVENOUS | Status: AC
  Filled 2015-11-17: qty 1

## 2015-11-17 MED ORDER — MIDAZOLAM HCL 2 MG/2ML IJ SOLN
INTRAMUSCULAR | Status: DC | PRN
  Administered 2015-11-17: 2 mg via INTRAVENOUS

## 2015-11-17 MED ORDER — OXYCODONE-ACETAMINOPHEN 5-325 MG PO TABS
1.0000 | ORAL_TABLET | ORAL | Status: DC | PRN
  Administered 2015-11-17: 2 via ORAL
  Filled 2015-11-17 (×2): qty 2

## 2015-11-17 MED ORDER — ONDANSETRON HCL 4 MG/2ML IJ SOLN
INTRAMUSCULAR | Status: DC | PRN
  Administered 2015-11-17: 4 mg via INTRAVENOUS

## 2015-11-17 MED ORDER — PROMETHAZINE HCL 25 MG/ML IJ SOLN: 6.2500 mg | INTRAMUSCULAR | Status: DC | PRN

## 2015-11-17 MED ORDER — HYDROMORPHONE HCL 1 MG/ML IJ SOLN
INTRAMUSCULAR | Status: AC
  Filled 2015-11-17: qty 1

## 2015-11-17 MED ORDER — PHENYLEPHRINE HCL 10 MG/ML IJ SOLN
INTRAMUSCULAR | Status: DC | PRN
  Administered 2015-11-17 (×5): 40 ug via INTRAVENOUS

## 2015-11-17 MED ORDER — LACTATED RINGERS IV SOLN
INTRAVENOUS | Status: DC
  Administered 2015-11-17: 13:00:00 via INTRAVENOUS

## 2015-11-17 MED ORDER — HYDRALAZINE HCL 20 MG/ML IJ SOLN
5.0000 mg | Freq: Once | INTRAMUSCULAR | Status: AC
  Administered 2015-11-17: 5 mg via INTRAVENOUS

## 2015-11-17 MED ORDER — PROPOFOL 10 MG/ML IV BOLUS
INTRAVENOUS | Status: DC | PRN
  Administered 2015-11-17: 200 mg via INTRAVENOUS

## 2015-11-17 MED ORDER — LACTATED RINGERS IR SOLN
Status: DC | PRN
  Administered 2015-11-17: 3000 mL

## 2015-11-17 MED ORDER — PROPOFOL 10 MG/ML IV BOLUS
INTRAVENOUS | Status: AC
  Filled 2015-11-17: qty 20

## 2015-11-17 MED ORDER — ONDANSETRON HCL 4 MG PO TABS: 4.0000 mg | ORAL_TABLET | Freq: Four times a day (QID) | ORAL | Status: DC | PRN

## 2015-11-17 MED ORDER — HYDRALAZINE HCL 20 MG/ML IJ SOLN
INTRAMUSCULAR | Status: AC
  Administered 2015-11-17: 5 mg via INTRAVENOUS
  Filled 2015-11-17: qty 1

## 2015-11-17 MED ORDER — HYDROMORPHONE HCL 1 MG/ML IJ SOLN
INTRAMUSCULAR | Status: DC | PRN
  Administered 2015-11-17: 1 mg via INTRAVENOUS

## 2015-11-17 MED ORDER — SODIUM CHLORIDE 0.9 % IJ SOLN
INTRAMUSCULAR | Status: AC
  Filled 2015-11-17: qty 50

## 2015-11-17 MED ORDER — SUGAMMADEX SODIUM 200 MG/2ML IV SOLN
INTRAVENOUS | Status: AC
  Filled 2015-11-17: qty 2

## 2015-11-17 MED ORDER — SUGAMMADEX SODIUM 200 MG/2ML IV SOLN
INTRAVENOUS | Status: DC | PRN
  Administered 2015-11-17: 200 mg via INTRAVENOUS

## 2015-11-17 MED ORDER — ROCURONIUM BROMIDE 100 MG/10ML IV SOLN
INTRAVENOUS | Status: DC | PRN
  Administered 2015-11-17: 50 mg via INTRAVENOUS
  Administered 2015-11-17: 10 mg via INTRAVENOUS

## 2015-11-17 MED ORDER — ONDANSETRON HCL 4 MG/2ML IJ SOLN
INTRAMUSCULAR | Status: AC
Start: 2015-11-17 — End: 2015-11-17
  Filled 2015-11-17: qty 2

## 2015-11-17 MED ORDER — FENTANYL CITRATE (PF) 100 MCG/2ML IJ SOLN
INTRAMUSCULAR | Status: DC | PRN
  Administered 2015-11-17: 50 ug via INTRAVENOUS
  Administered 2015-11-17: 100 ug via INTRAVENOUS
  Administered 2015-11-17 (×2): 50 ug via INTRAVENOUS

## 2015-11-17 MED ORDER — BUPIVACAINE HCL (PF) 0.25 % IJ SOLN
INTRAMUSCULAR | Status: AC
  Filled 2015-11-17: qty 30

## 2015-11-17 MED ORDER — MIDAZOLAM HCL 2 MG/2ML IJ SOLN
INTRAMUSCULAR | Status: AC
  Filled 2015-11-17: qty 2

## 2015-11-17 MED ORDER — BUPIVACAINE HCL (PF) 0.25 % IJ SOLN
INTRAMUSCULAR | Status: DC | PRN
  Administered 2015-11-17: 4 mL

## 2015-11-17 MED ORDER — HYDROMORPHONE HCL 1 MG/ML IJ SOLN
INTRAMUSCULAR | Status: AC
  Administered 2015-11-17: 0.5 mg via INTRAVENOUS
  Filled 2015-11-17: qty 1

## 2015-11-17 MED ORDER — IBUPROFEN 800 MG PO TABS: 800.0000 mg | ORAL_TABLET | Freq: Three times a day (TID) | ORAL | 0 refills | Status: DC | PRN

## 2015-11-17 MED ORDER — KETOROLAC TROMETHAMINE 30 MG/ML IJ SOLN
INTRAMUSCULAR | Status: AC
Start: 2015-11-17 — End: 2015-11-17
  Filled 2015-11-17: qty 1

## 2015-11-17 MED ORDER — NEOSTIGMINE METHYLSULFATE 10 MG/10ML IV SOLN
INTRAVENOUS | Status: AC
  Filled 2015-11-17: qty 1

## 2015-11-17 MED ORDER — IBUPROFEN 600 MG PO TABS: 600.0000 mg | ORAL_TABLET | Freq: Four times a day (QID) | ORAL | Status: DC | PRN

## 2015-11-17 MED ORDER — KETOROLAC TROMETHAMINE 30 MG/ML IJ SOLN
INTRAMUSCULAR | Status: DC | PRN
  Administered 2015-11-17: 30 mg via INTRAVENOUS

## 2015-11-17 MED ORDER — DEXAMETHASONE SODIUM PHOSPHATE 4 MG/ML IJ SOLN: INTRAMUSCULAR | Status: DC | PRN

## 2015-11-17 MED ORDER — SODIUM CHLORIDE 0.9 % IV SOLN
INTRAVENOUS | Status: DC | PRN
  Administered 2015-11-17: 60 mL

## 2015-11-17 MED ORDER — DEXAMETHASONE SODIUM PHOSPHATE 10 MG/ML IJ SOLN
INTRAMUSCULAR | Status: AC
  Filled 2015-11-17: qty 1

## 2015-11-17 MED ORDER — SCOPOLAMINE 1 MG/3DAYS TD PT72
1.0000 | MEDICATED_PATCH | Freq: Once | TRANSDERMAL | Status: DC
  Administered 2015-11-17: 1.5 mg via TRANSDERMAL

## 2015-11-17 MED ORDER — MORPHINE SULFATE (PF) 4 MG/ML IV SOLN: 2.0000 mg | INTRAVENOUS | Status: DC | PRN

## 2015-11-17 MED ORDER — LIDOCAINE HCL (CARDIAC) 20 MG/ML IV SOLN
INTRAVENOUS | Status: DC | PRN
  Administered 2015-11-17: 100 mg via INTRAVENOUS

## 2015-11-17 MED ORDER — KETOROLAC TROMETHAMINE 30 MG/ML IJ SOLN
30.0000 mg | Freq: Four times a day (QID) | INTRAMUSCULAR | Status: DC
  Administered 2015-11-17: 30 mg via INTRAVENOUS
  Filled 2015-11-17: qty 1

## 2015-11-17 MED ORDER — OXYCODONE-ACETAMINOPHEN 5-325 MG PO TABS: 1.0000 | ORAL_TABLET | ORAL | 0 refills | Status: DC | PRN

## 2015-11-17 MED ORDER — HYDROMORPHONE HCL 1 MG/ML IJ SOLN
0.2500 mg | INTRAMUSCULAR | Status: DC | PRN
  Administered 2015-11-17 (×2): 0.5 mg via INTRAVENOUS

## 2015-11-17 MED ORDER — LACTATED RINGERS IV SOLN
INTRAVENOUS | Status: DC
  Administered 2015-11-17 (×4): via INTRAVENOUS

## 2015-11-17 MED ORDER — DEXAMETHASONE SODIUM PHOSPHATE 10 MG/ML IJ SOLN
INTRAMUSCULAR | Status: DC | PRN
  Administered 2015-11-17: 10 mg via INTRAVENOUS

## 2015-11-17 SURGICAL SUPPLY — 58 items
APPLICATOR ARISTA FLEXITIP XL (MISCELLANEOUS) IMPLANT
BARRIER ADHS 3X4 INTERCEED (GAUZE/BANDAGES/DRESSINGS) IMPLANT
BENZOIN TINCTURE PRP APPL 2/3 (GAUZE/BANDAGES/DRESSINGS) IMPLANT
CABLE HIGH FREQUENCY MONO STRZ (ELECTRODE) ×4 IMPLANT
CANISTER SUCT 3000ML (MISCELLANEOUS) ×4 IMPLANT
CLOTH BEACON ORANGE TIMEOUT ST (SAFETY) ×4 IMPLANT
DECANTER SPIKE VIAL GLASS SM (MISCELLANEOUS) ×8 IMPLANT
DRSG COVADERM PLUS 2X2 (GAUZE/BANDAGES/DRESSINGS) ×8 IMPLANT
DRSG OPSITE POSTOP 3X4 (GAUZE/BANDAGES/DRESSINGS) ×4 IMPLANT
DURAPREP 26ML APPLICATOR (WOUND CARE) ×4 IMPLANT
FILTER SMOKE EVAC LAPAROSHD (FILTER) ×4 IMPLANT
FORCEPS CUTTING 33CM 5MM (CUTTING FORCEPS) IMPLANT
GLOVE BIO SURGEON STRL SZ 6.5 (GLOVE) ×8 IMPLANT
GLOVE BIOGEL PI IND STRL 7.0 (GLOVE) ×9 IMPLANT
GLOVE BIOGEL PI INDICATOR 7.0 (GLOVE) ×3
GOWN STRL REUS W/TWL LRG LVL3 (GOWN DISPOSABLE) ×12 IMPLANT
HEMOSTAT ARISTA ABSORB 3G PWDR (MISCELLANEOUS) IMPLANT
LIGASURE BLUNT 5MM 37CM (INSTRUMENTS) ×4 IMPLANT
LIQUID BAND (GAUZE/BANDAGES/DRESSINGS) ×4 IMPLANT
NEEDLE INSUFFLATION 120MM (ENDOMECHANICALS) ×4 IMPLANT
NS IRRIG 1000ML POUR BTL (IV SOLUTION) IMPLANT
OCCLUDER COLPOPNEUMO (BALLOONS) ×4 IMPLANT
PACK LAPAROSCOPY BASIN (CUSTOM PROCEDURE TRAY) ×4 IMPLANT
PACK VAGINAL MINOR WOMEN LF (CUSTOM PROCEDURE TRAY) ×4 IMPLANT
PAD TRENDELENBURG POSITION (MISCELLANEOUS) ×4 IMPLANT
POUCH LAPAROSCOPIC INSTRUMENT (MISCELLANEOUS) ×4 IMPLANT
POUCH SPECIMEN RETRIEVAL 10MM (ENDOMECHANICALS) IMPLANT
REGULATOR SUCT PEDS (MISCELLANEOUS) IMPLANT
SCISSORS LAP 5X35 DISP (ENDOMECHANICALS) ×4 IMPLANT
SET CYSTO W/LG BORE CLAMP LF (SET/KITS/TRAYS/PACK) ×4 IMPLANT
SET IRRIG TUBING LAPAROSCOPIC (IRRIGATION / IRRIGATOR) ×4 IMPLANT
SET TRI-LUMEN FLTR TB AIRSEAL (TUBING) ×4 IMPLANT
SLEEVE ADV FIXATION 5X100MM (TROCAR) ×4 IMPLANT
SOLUTION ELECTROLUBE (MISCELLANEOUS) ×4 IMPLANT
STRIP CLOSURE SKIN 1/4X3 (GAUZE/BANDAGES/DRESSINGS) IMPLANT
SUT PLAIN 3 0 FS 2 27 (SUTURE) ×4 IMPLANT
SUT VIC AB 0 CT1 27 (SUTURE) ×2
SUT VIC AB 0 CT1 27XBRD ANBCTR (SUTURE) ×6 IMPLANT
SUT VICRYL 0 UR6 27IN ABS (SUTURE) IMPLANT
SUT VICRYL 4-0 PS2 18IN ABS (SUTURE) ×4 IMPLANT
SUT VLOC 180 0 9IN  GS21 (SUTURE) ×1
SUT VLOC 180 0 9IN GS21 (SUTURE) ×3 IMPLANT
SYSTEM CARTER THOMASON II (TROCAR) ×4 IMPLANT
TIP RUMI ORANGE 6.7MMX12CM (TIP) IMPLANT
TIP UTERINE 5.1X6CM LAV DISP (MISCELLANEOUS) IMPLANT
TIP UTERINE 6.7X10CM GRN DISP (MISCELLANEOUS) IMPLANT
TIP UTERINE 6.7X6CM WHT DISP (MISCELLANEOUS) IMPLANT
TIP UTERINE 6.7X8CM BLUE DISP (MISCELLANEOUS) ×4 IMPLANT
TOWEL OR 17X24 6PK STRL BLUE (TOWEL DISPOSABLE) ×8 IMPLANT
TRAY FOLEY CATH SILVER 14FR (SET/KITS/TRAYS/PACK) ×4 IMPLANT
TROCAR ADV FIXATION 5X100MM (TROCAR) ×4 IMPLANT
TROCAR PORT AIRSEAL 8X100 (TROCAR) ×4 IMPLANT
TROCAR XCEL DIL TIP R 11M (ENDOMECHANICALS) IMPLANT
TROCAR XCEL NON-BLD 11X100MML (ENDOMECHANICALS) ×4 IMPLANT
TROCAR XCEL NON-BLD 5MMX100MML (ENDOMECHANICALS) ×4 IMPLANT
TROCAR XCEL OPT SLVE 5M 100M (ENDOMECHANICALS) IMPLANT
WARMER LAPAROSCOPE (MISCELLANEOUS) ×4 IMPLANT
WATER STERILE IRR 1000ML POUR (IV SOLUTION) ×4 IMPLANT

## 2015-11-17 NOTE — Progress Notes (Signed)
Discharge teaching complete with patient. Patient understood all instructions and did not have any questions. Pt pushed via wheelchair and discharged home to family.

## 2015-11-17 NOTE — Anesthesia Postprocedure Evaluation (Signed)
Anesthesia Post Note  Patient: VESTA WHEELAND  Procedure(s) Performed: Procedure(s) (LRB): HYSTERECTOMY TOTAL LAPAROSCOPIC (N/A) LAPAROSCOPIC BILATERAL SALPINGECTOMY poss BSO (Bilateral) CYSTOSCOPY (N/A)  Patient location during evaluation: PACU Anesthesia Type: General Level of consciousness: awake and alert Pain management: pain level controlled Vital Signs Assessment: post-procedure vital signs reviewed and stable Respiratory status: spontaneous breathing, nonlabored ventilation, respiratory function stable and patient connected to nasal cannula oxygen Cardiovascular status: blood pressure returned to baseline and stable Postop Assessment: no signs of nausea or vomiting Anesthetic complications: no    Last Vitals:  Vitals:   11/17/15 1030 11/17/15 1045  BP:  (!) 145/101  Pulse: 88 87  Resp: 13 14  Temp:      Last Pain:  Vitals:   11/17/15 0559  TempSrc: Oral                 ROSE,GEORGE S

## 2015-11-17 NOTE — Anesthesia Preprocedure Evaluation (Signed)
Anesthesia Evaluation  Patient identified by MRN, date of birth, ID band Patient awake    Reviewed: Allergy & Precautions, NPO status , Patient's Chart, lab work & pertinent test results  Airway Mallampati: II  TM Distance: >3 FB Neck ROM: Full    Dental no notable dental hx.    Pulmonary neg pulmonary ROS, former smoker,    Pulmonary exam normal breath sounds clear to auscultation       Cardiovascular hypertension, Normal cardiovascular exam Rhythm:Regular Rate:Normal     Neuro/Psych negative neurological ROS  negative psych ROS   GI/Hepatic negative GI ROS, Neg liver ROS,   Endo/Other  negative endocrine ROS  Renal/GU negative Renal ROS  negative genitourinary   Musculoskeletal  (+) Arthritis , Rheumatoid disorders,    Abdominal   Peds negative pediatric ROS (+)  Hematology   Anesthesia Other Findings   Reproductive/Obstetrics negative OB ROS                             Anesthesia Physical Anesthesia Plan  ASA: II  Anesthesia Plan: General   Post-op Pain Management:    Induction: Intravenous  Airway Management Planned: Oral ETT  Additional Equipment:   Intra-op Plan:   Post-operative Plan: Extubation in OR  Informed Consent: I have reviewed the patients History and Physical, chart, labs and discussed the procedure including the risks, benefits and alternatives for the proposed anesthesia with the patient or authorized representative who has indicated his/her understanding and acceptance.   Dental advisory given  Plan Discussed with: CRNA and Surgeon  Anesthesia Plan Comments:         Anesthesia Quick Evaluation

## 2015-11-17 NOTE — Transfer of Care (Signed)
Immediate Anesthesia Transfer of Care Note  Patient: [name]  Procedure(s) Performed: Procedure(s): HYSTERECTOMY TOTAL LAPAROSCOPIC (N/A) LAPAROSCOPIC BILATERAL SALPINGECTOMY poss BSO (Bilateral) CYSTOSCOPY (N/A)  Patient Location: PACU  Anesthesia Type:General  Level of Consciousness: awake, alert , oriented and patient cooperative  Airway & Oxygen Therapy: Patient Spontanous Breathing and Patient connected to nasal cannula oxygen  Post-op Assessment: Report given to RN and Post -op Vital signs reviewed and stable  Post vital signs: Reviewed and stable  Last Vitals:  TEMP 98.5 BP 139/99 HR 88 RR 12 POX 97  Last Pain: 0     Patients Stated Pain Goal: 1 (25/63/89 3734)  Complications: No apparent anesthesia complications

## 2015-11-17 NOTE — Anesthesia Postprocedure Evaluation (Signed)
Anesthesia Post Note  Patient: Nicole Figueroa  Procedure(s) Performed: Procedure(s) (LRB): HYSTERECTOMY TOTAL LAPAROSCOPIC (N/A) LAPAROSCOPIC BILATERAL SALPINGECTOMY poss BSO (Bilateral) CYSTOSCOPY (N/A)  Patient location during evaluation: [facility] Anesthesia Type: General Level of consciousness: awake and alert Pain management: satisfactory to patient Vital Signs Assessment: post-procedure vital signs reviewed and stable Respiratory status: spontaneous breathing and respiratory function stable Cardiovascular status: stable Postop Assessment: adequate PO intake Anesthetic complications: no     Last Vitals:  Vitals:   11/17/15 1345 11/17/15 1718  BP: (!) 163/81 137/80  Pulse: 90 91  Resp: 18 18  Temp: 36.8 C 37.2 C    Last Pain:  Vitals:   11/17/15 1718  TempSrc: Oral  PainSc:    Pain Goal: Patients Stated Pain Goal: 1 (11/17/15 1634)               FUSSELL,WYNN

## 2015-11-17 NOTE — Brief Op Note (Signed)
11/17/2015  10:06 AM  PATIENT:  Sallee Lange  40 y.o. female  PRE-OPERATIVE DIAGNOSIS:  Menorrhagia with irregular menses, anemia  POST-OPERATIVE DIAGNOSIS:  Menorrhagia with irregular menses, anemia  PROCEDURE:   LAPAROSCOPIC HYSTERECTOMY WITH BILATERAL SALPINGECTOMY, CYSTOSCOPY. SURGEON:  Surgeon(s) and Role:    * Brook E Yisroel Ramming, MD - Primary    * Salvadore Dom, MD - Assisting  PHYSICIAN ASSISTANT: NA  ASSISTANTS: Sumner Boast, MD   ANESTHESIA:   local, general and Intraperitoneal ropivicaine 30 cc in 30 cc NS.  EBL:  Total I/O In: 1800 [I.V.:1800] Out: 600 [Urine:550; Blood:50]  BLOOD ADMINISTERED:none  DRAINS: none   LOCAL MEDICATIONS USED:  MARCAINE     SPECIMEN:  Source of Specimen:  Uterus, cervix, bilateral tubes.  DISPOSITION OF SPECIMEN:  PATHOLOGY  COUNTS:  YES  TOURNIQUET:  * No tourniquets in log *  DICTATION: .Note written in [place]: Extended observation.   PATIENT DISPOSITION:  PACU - hemodynamically stable.   Delay start of Pharmacological VTE agent (>24hrs) due to surgical blood loss or risk of bleeding: not applicable

## 2015-11-17 NOTE — Anesthesia Procedure Notes (Signed)
Procedure Name: Intubation Date/Time: 11/17/2015 7:37 AM Performed by: Brock Ra Pre-anesthesia Checklist: Patient identified, Emergency Drugs available, Suction available, Patient being monitored and Timeout performed Patient Re-evaluated:Patient Re-evaluated prior to inductionOxygen Delivery Method: Circle system utilized Preoxygenation: Pre-oxygenation with 100% oxygen Intubation Type: IV induction Ventilation: Mask ventilation without difficulty Laryngoscope Size: Mac and 3 Grade View: Grade I Number of attempts: 1 Airway Equipment and Method: Stylet Placement Confirmation: ETT inserted through vocal cords under direct vision,  positive ETCO2 and breath sounds checked- equal and bilateral Secured at: 21 cm Tube secured with: Tape Dental Injury: Teeth and Oropharynx as per pre-operative assessment

## 2015-11-17 NOTE — Progress Notes (Signed)
Update to History and Physical  No marked change in status since last office visit.  Patient examined.  OK to proceed with surgery.

## 2015-11-17 NOTE — OR Nursing (Signed)
l °

## 2015-11-17 NOTE — Discharge Instructions (Signed)
Total Laparoscopic Hysterectomy, Care After Refer to this sheet in the next few weeks. These instructions provide you with information on caring for yourself after your procedure. Your health care provider may also give you more specific instructions. Your treatment has been planned according to current medical practices, but problems sometimes occur. Call your health care provider if you have any problems or questions after your procedure. WHAT TO EXPECT AFTER THE PROCEDURE  Pain and bruising at the incision sites. You will be given pain medicine to control it.  Menopausal symptoms such as hot flashes, night sweats, and insomnia if your ovaries were removed.  Sore throat from the breathing tube that was inserted during surgery. HOME CARE INSTRUCTIONS  Only take over-the-counter or prescription medicines for pain, discomfort, or fever as directed by your health care provider.   Do not take aspirin. It can cause bleeding.   Do not drive when taking pain medicine.   Follow your health care provider's advice regarding diet, exercise, lifting, driving, and general activities.   Resume your usual diet as directed and allowed.   Get plenty of rest and sleep.   Do not douche, use tampons, or have sexual intercourse for at least 6 weeks, or until your health care provider gives you permission.   Change your bandages (dressings) as directed by your health care provider.   Monitor your temperature and notify your health care provider of a fever.   Take showers instead of baths for 2-3 weeks.   Do not drink alcohol until your health care provider gives you permission.   If you develop constipation, you may take a mild laxative with your health care provider's permission. Bran foods may help with constipation problems. Drinking enough fluids to keep your urine clear or pale yellow may help as well.   Try to have someone home with you for 1-2 weeks to help around the house.    Keep all of your follow-up appointments as directed by your health care provider.  SEEK MEDICAL CARE IF:  You have swelling, redness, or increasing pain around your incision sites.   You have pus coming from your incision.   You notice a bad smell coming from your incision.   Your incision breaks open.   You feel dizzy or lightheaded.   You have pain or bleeding when you urinate.   You have persistent diarrhea.   You have persistent nausea and vomiting.   You have abnormal vaginal discharge.   You have a rash.   You have any type of abnormal reaction or develop an allergy to your medicine.   You have poor pain control with your prescribed medicine.  SEEK IMMEDIATE MEDICAL CARE IF:  You have chest pain or shortness of breath.  You have severe abdominal pain that is not relieved with pain medicine.  You have pain or swelling in your legs. MAKE SURE YOU:  Understand these instructions.  Will watch your condition.  Will get help right away if you are not doing well or get worse.   This information is not intended to replace advice given to you by your health care provider. Make sure you discuss any questions you have with your health care provider.   Document Released: 12/12/2012 Document Revised: 02/26/2013 Document Reviewed: 12/12/2012 Elsevier Interactive Patient Education [employer].

## 2015-11-17 NOTE — Progress Notes (Signed)
Day of Surgery Procedure(s) (LRB): HYSTERECTOMY TOTAL LAPAROSCOPIC (N/A) LAPAROSCOPIC BILATERAL SALPINGECTOMY poss BSO (Bilateral) CYSTOSCOPY (N/A)  Subjective: Patient reports tolerating PO and no problems voiding.   Taking oral pain medication.  Pain is 3/10 and is manageable. Ambulating.  Wants discharge to home today.   Received Hydralazine in PACU for elevated blood pressure.   Objective: I have reviewed patient's vital signs, intake and output and labs. Vitals:   11/17/15 1345 11/17/15 1718  BP: (!) 163/81 137/80  Pulse: 90 91  Resp: 18 18  Temp: 98.3 F (36.8 C) 98.9 F (37.2 C)   I/O - 2400 cc/2450 cc.  WBC - 12.4 HGB - 11.3  General: alert and cooperative Resp: clear to auscultation bilaterally Cardio: regular rate and rhythm, S1, S2 normal, no murmur, click, rub or gallop GI: soft, non-tender; bowel sounds normal; no masses,  no organomegaly and incision: clean, dry and intact Extremities: PAS and Ted hose on. DPs 2+ bilaterally. Vaginal Bleeding: none  Assessment: s/p Procedure(s): HYSTERECTOMY TOTAL LAPAROSCOPIC (N/A) LAPAROSCOPIC BILATERAL SALPINGECTOMY poss BSO (Bilateral) CYSTOSCOPY (N/A): progressing well Meeting criteria for discharge.  Plan: Discharge home  Surgical findings and procedure reviewed with patient.  Rx for Percocet and Motrin.  Instructions and precautions given.  Follow up in 6 days.  Patient will call her PCP to have a follow up appointment regarding her elevated blood pressure.    LOS: 0 days    Arloa Koh 11/17/2015, 5:34 PM

## 2015-11-17 NOTE — Addendum Note (Signed)
Addendum  created 11/17/15 1724 by Flossie Dibble, CRNA   Sign clinical note

## 2015-11-17 NOTE — Op Note (Signed)
OPERATIVE REPORT   PREOPERATIVE DIAGNOSIS:  Menorrhagia with irregular menses, anemia.  POSTOPERATIVE DIAGNOSIS:   Menorrhagia with irregular menses, anemia.  PROCEDURES: Total laparoscopic hysterectomy with bilateral salpingectomy, cystoscopy  SURGEON: Lenard Galloway, M.D.  ASSISTANT:  Sumner Boast, M.D.  ANESTHESIA: General endotracheal, intraperitoneal ropivicaine 30 mL diluted in 30 mL of normal saline, local with 0.25% Marcaine.  IVF:  1800 cc LR.  ESTIMATED BLOOD LOSS:  50 cc.  URINE OUTPUT: 550 cc.  COMPLICATIONS: None.  INDICATIONS FOR THE PROCEDURE:    The patient is a 40 year old 73 P4 female who presents with heavy and irregular vaginal bleeding.  She is status post hysteroscopy with dilation and curettage which demonstrated a benign endometrial polyp.  Her bleeding has continued post surgery, and she has been treated with Megace.  She declines further medical therapy and future childbearing, and is requesting hysterectomy procedure.   A plan is made to proceed with a total laparoscopic hysterectomy with bilateral salpingectomy, and cystoscopy after risks, benefits, and alternatives are reviewed.  FINDINGS:    Laparoscopy revealed a normal uterus, bilateral tubes and ovaries.  The upper abdomen was normal and demonstrated a normal liver.  There was no adhesive disease or endometriosis were seen in the abdomen or pelvis.  Cystoscopy at the termination of the procedure showed the bladder to be normal throughout 360 degrees including the bladder dome and trigone. There was no evidence of any foreign body in the bladder or the urethra. There was no evidence of any lesions  of the bladder or the urethra.  Both of the ureters were noted to be patent bilaterally.  SPECIMENS:    The uterus, cervix, and bilateral tubes were went to pathology.   DESCRIPTION OF PROCEDURE:   The patient was reidentified in the preoperative hold area.  She did receive   Cefotetan IV for antibiotic prophylaxis. She received TED hose and PAS stockings for DVT prophylaxis.  In the operating room, the patient was placed in the dorsal lithotomy position on the operating room table. The anti-slip pad was used under the patient. Her legs were placed in the Nicasio stirrups and her arms were both tucked at her sides. The patient received general endotracheal anesthesia. The abdomen and vagina were then sterilely prepped and she was sterilely draped.  A speculum was placed in the vagina and a single-tooth tenaculum was placed on the anterior cervical lip. A figure-of-eight suture of 0 Vicryl was placed on each the anterior and the posterior cervical lips. The uterus was sounded to __almost 9 ___ cm. The cervix was then dilated with [facility] dilators. A #  __8____ RUMI tip with a KOH ring was then placed through the cervix and into the uterine cavity without difficulty. The remaining vaginal instruments were then removed. A Foley catheter was placed inside the bladder.  Attention was turned to the abdomen where the umbilical region was injected with 0.25% Marcaine and a small incision created.  A Veress needle was then used to insufflate the abdomen with CO2 gas after a saline drop test was performed and the fluid flowed freely.  A 5 mm umbilical incision was created with a scalpel after the skin. A 5 mm camera port was then placed using the Optiview. 5 mm incisions were then created in the left upper abdomen and the left lower abdomen after the skin was injected locally with 0.25% Marcaine.  The 5 mm trocars were then placed under visualization of the laparoscope.  An 8 mm  trocar was placed in the right lower quadrant after injecting with Marcaine and incising with a scalpel.   Ropivicine was placed inside the peritoneal cavity.   At this time, the patient was placed in Trendelenburg position. An inspection of the abdomen and pelvis was performed. The findings are as noted  above.    At this time, the bilateral ureters were identified.  The left fallopian tube was grasped and the Ligasure was used to cauterize and cut through the mesosalpinx and then the proximal tube.  The specimen was sent to pathology.  The left utero-ovarian ligament was similarly cauterized and cut with the Ligasure. The left round ligament was then cauterized and divided with same instrument. Dissection was performed to the anterior and posterior leaves of the broad ligaments using the monopolar laparoscopic scissors. The incision was carried across the anterior cul- de-sac along the vesicouterine fold and the bladder was dissected away from the cervix using monopolar cautery scissors. The peritoneum was taken down posteriorly. The left uterine artery was skeletonized at this time using sharp dissection and monopolar cautery.   It was then cauterized and cut with the Ligasure instrument.  Attention was turned to the patient's right-hand side at this time. The same procedure that was performed on the left side was repeated on the right side with respect to the isolation, cautery, and transection of the vessels and the bladder flap dissection.   The right fallopian tube was removed from the peritoneal cavity as well.  The KOH ring was nicely visible. The colpotomy incision was performed with the laparoscopic monopolar scissors in a circumferential fashion. The specimen was then removed from the peritoneal cavity and was sent to Pathology. The balloon occluder was placed in the vagina. The vaginal cuff was sutured at this time using a running suture of 0 V-Loc. The vagina was closed from the patient's right hand side to the left hand side and then back 2 sutures towards the midline. This provided good full-thickness closure of the vaginal cuff.  The laparoscopic needle for suturing was removed from the peritoneal cavity.  The pelvis was irrigated and suctioned.  The pneumoperitoneal was let down.   There was good hemostasis of the operative sites and pedicles.  The 8 mm trocar was removed and the [employer] instrument was used to close the fascia with a 0/0 vicryl suture.   The remaining left sided laparoscopic trocars were removed under visualization of the laparoscope. The CO2 pneumoperitoneum was released. The patient received manual breaths to remove any remaining CO2 gas and then the umbilical trocar was removed.  The patient's 3-way Foley catheter was removed at this time and cystoscopy was performed and the findings are as noted above. The Foley catheter was left out.   Final inspection of the vagina demonstrated good hemostasis of the vaginal cuff.  All skin incisions were closed with subcuticular sutures of 4-0 Vicryl. Dermabond was placed over the incisions.  This concluded the patient's procedure. She was extubated and escorted to the recovery room in stable and awake condition. There were no complications to the procedure. All needle, instrument, and sponge counts were correct.     Lenard Galloway, M.D.

## 2015-11-18 ENCOUNTER — Encounter (HOSPITAL_COMMUNITY): Payer: Self-pay | Admitting: Obstetrics and Gynecology

## 2015-11-23 ENCOUNTER — Ambulatory Visit (INDEPENDENT_AMBULATORY_CARE_PROVIDER_SITE_OTHER): Payer: Commercial Managed Care - HMO | Admitting: Obstetrics and Gynecology

## 2015-11-23 ENCOUNTER — Encounter: Payer: Self-pay | Admitting: Obstetrics and Gynecology

## 2015-11-23 VITALS — BP 132/76 | HR 100 | Resp 18 | Ht 67.0 in | Wt 207.0 lb

## 2015-11-23 DIAGNOSIS — Z9889 Other specified postprocedural states: Secondary | ICD-10-CM

## 2015-11-23 LAB — CBC
HCT: 37.5 % (ref 35.0–45.0)
Hemoglobin: 11.7 g/dL (ref 11.7–15.5)
MCH: 25.1 pg — ABNORMAL LOW (ref 27.0–33.0)
MCHC: 31.2 g/dL — ABNORMAL LOW (ref 32.0–36.0)
MCV: 80.5 fL (ref 80.0–100.0)
MPV: 9.4 fL (ref 7.5–12.5)
Platelets: 380 10*3/uL (ref 140–400)
RBC: 4.66 MIL/uL (ref 3.80–5.10)
RDW: 16.5 % — ABNORMAL HIGH (ref 11.0–15.0)
WBC: 7 10*3/uL (ref 3.8–10.8)

## 2015-11-23 NOTE — Progress Notes (Signed)
GYNECOLOGY  VISIT   HPI: 40 y.o.   Married  Caucasian  female   [phone] with Patient's last menstrual period was 10/27/2015 (exact date).   here for   1 week post op.  Took a Percocet yesterday.  Some difficulty sleeping mostly on the right side.  Bowel function returned.  Nausea in the afternoon/evening.  Resolves spontaneously with saltine crackers.  No vomiting.  Voiding well.  No bleeding.  Feels hot and sweating.   Final pathology - benign endometrial polyp, benign cervix and tubes.   GYNECOLOGIC HISTORY: Patient's last menstrual period was 10/27/2015 (exact date). Contraception:  Hysterectomy Menopausal hormone therapy:  none Last mammogram:  none Last pap smear:   2015 normal per patient        OB History    Gravida Para Term Preterm AB Living   4 4 4     4    SAB TAB Ectopic Multiple Live Births                     Patient Active Problem List   Diagnosis Date Noted  . Status post laparoscopic hysterectomy 11/17/2015  . Hematuria 09/05/2015  . UTI (lower urinary tract infection) 09/05/2015  . Urinary urgency 09/05/2015  . Bilateral low back pain without sciatica   . Essential hypertension     Past Medical History:  Diagnosis Date  . Abnormal uterine bleeding 2016, 06/2015  . Anemia    taking iron supplement  . Depression    when had a loss in family--fam hx of depression  . Hypertension   . RA (rheumatoid arthritis) ([place]) 2013   --trying to control with diet--followed by PCP  . Shortness of breath dyspnea    w/ activity ? d/t anemia  . TMJ dysfunction    bilateral  . Wears glasses     Past Surgical History:  Procedure Laterality Date  . broken foot Right 08/2014  . CYSTOSCOPY N/A 11/17/2015   Procedure: CYSTOSCOPY;  Surgeon: Nunzio Cobbs, MD;  Location: [facility];  Service: Gynecology;  Laterality: N/A;  . HYSTEROSCOPY W/D&C N/A 07/03/2015   Procedure: DILATATION AND CURETTAGE /HYSTEROSCOPY;  Surgeon: Nunzio Cobbs, MD;   [facility];  Service: Gynecology;  Laterality: N/A;  . LAPAROSCOPIC BILATERAL SALPINGECTOMY Bilateral 11/17/2015   Procedure: LAPAROSCOPIC BILATERAL SALPINGECTOMY poss BSO;  Surgeon: Nunzio Cobbs, MD;  Location: [facility];  Service: Gynecology;  Laterality: Bilateral;  . LAPAROSCOPIC HYSTERECTOMY N/A 11/17/2015   Procedure: HYSTERECTOMY TOTAL LAPAROSCOPIC;  Surgeon: Nunzio Cobbs, MD;  Location: [facility];  Service: Gynecology;  Laterality: N/A;    Current Outpatient Prescriptions  Medication Sig Dispense Refill  . hydrochlorothiazide (HYDRODIURIL) 25 MG tablet Take 1 tablet by mouth daily.    Marland Kitchen ibuprofen (ADVIL,MOTRIN) 800 MG tablet Take 1 tablet (800 mg total) by mouth every 8 (eight) hours as needed for headache or moderate pain. 30 tablet 0  . Melatonin 5 MG CHEW Chew 2 capsules by mouth at bedtime as needed.    . Multiple Vitamin (MULTIVITAMIN WITH MINERALS) TABS tablet Take 1 tablet by mouth daily.    Marland Kitchen oxyCODONE-acetaminophen (PERCOCET/ROXICET) 5-325 MG tablet Take 1-2 tablets by mouth every 4 (four) hours as needed for severe pain (moderate to severe pain (when tolerating fluids)). 30 tablet 0   No current facility-administered medications for this visit.      ALLERGIES: Review of patient's allergies indicates not on file.  Family  History  Problem Relation Age of Onset  . Heart disease Father     dec age 29  . Hypertension Mother   . Hypertension Maternal Grandmother   . Stroke Maternal Grandmother   . Cancer Maternal Grandfather     Dec Glioblastoma  . Cancer Other     Dec Glioblastoma  . Cancer Maternal Aunt     dec brain ca--glioblastoma    Social History   Social History  . Marital status: Married    Spouse name: N/A  . Number of children: N/A  . Years of education: N/A   Occupational History  . Not on file.   Social History Main Topics  . Smoking status: Former Smoker    Packs/day: 2.00    Types: Cigarettes     Quit date: 03/07/2006  . Smokeless tobacco: Never Used  . Alcohol use 1.2 - 1.8 oz/week    2 - 3 Standard drinks or equivalent per week     Comment: 3-5 glasses of wine/week of occ. marguerita  . Drug use: No  . Sexual activity: Yes    Partners: Male    Birth control/ protection: Other-see comments     Comment: husband with vasectomy   Other Topics Concern  . Not on file   Social History Narrative  . No narrative on file    ROS:  Pertinent items are noted in HPI.  PHYSICAL EXAMINATION:    LMP 10/27/2015 (Exact Date)     General appearance: alert, cooperative and appears stated age   Abdomen: incisions intact.  Soft, non-tender, no masses,  no organomegaly.  Chaperone was present for exam.  ASSESSMENT  Status post total laparoscopic hysterectomy with bilateral salpingectomy and cystoscopy.  Mild post op nausea.  No sign of ileus.  PLAN   Surgical findings, procedure, and pathology report reviewed with patient.  Continue decreased activity.  Declines Rx for antiemetic.  May call back if desires Zofran.  Follow up for 6 week post op.    An After Visit Summary was printed and given to the patient.

## 2015-12-01 ENCOUNTER — Telehealth: Payer: Self-pay | Admitting: Obstetrics and Gynecology

## 2015-12-01 NOTE — Telephone Encounter (Signed)
Encounter reviewed and closed.  I agree with your recommendations.

## 2015-12-01 NOTE — Telephone Encounter (Signed)
Patient had a total laparoscopic hysterectomy with bilateral salpingectomy and cystoscopy on 11/17/2015 with Dr.Silva. Spoke with patient. Patient states that 3 days ago she began to have constant right lower quadrant pain right above her pelvic region. Pain is relieved with laying down. Pain is exacerbated with movement, coughing, or sneezing. Reports last night pain was increased to an 8/10 after an episode of sneezing. Patient took 1 tramadol which brought pain to a 3/10. Reports taking 1 tramadol this morning. Pain is currently 3/10. States that pain is not located where her incisions are and that incisions are healing well. When the patient used the restroom this morning she noticed a light amount of bleeding when she wiped. Denies any blood in her urine in the toilet, burning with urination, fever, or chills. Reports lower back discomfort, but states this her daily normal. Feels she may not be emptying her bladder all the way with urination. Advised patient she will need to be seen in the office for further evaluation. Offered appointment for today with covering provider, but patient declines. Appointment scheduled for 12/02/2015 at 8 am with Dr.Silva. Patient is agreeable to date and time. Aware if pain increases, if she develops further bleeding, fever, or chills, she will need to be seen in the office for immediate evaluation today. If this occurs overnight will need to be seen at [facility] for evaluation. Patient is agreeable.  Routing to provider for final review.

## 2015-12-01 NOTE — Telephone Encounter (Signed)
Patient had surgery 2 weeks ago and is having some pain.

## 2015-12-02 ENCOUNTER — Encounter: Payer: Self-pay | Admitting: Obstetrics and Gynecology

## 2015-12-02 ENCOUNTER — Ambulatory Visit (INDEPENDENT_AMBULATORY_CARE_PROVIDER_SITE_OTHER): Payer: Commercial Managed Care - HMO | Admitting: Obstetrics and Gynecology

## 2015-12-02 VITALS — BP 140/88 | HR 76 | Ht 67.0 in | Wt 205.4 lb

## 2015-12-02 DIAGNOSIS — R1031 Right lower quadrant pain: Secondary | ICD-10-CM

## 2015-12-02 DIAGNOSIS — R339 Retention of urine, unspecified: Secondary | ICD-10-CM

## 2015-12-02 LAB — POCT URINALYSIS DIPSTICK
Bilirubin, UA: NEGATIVE
Glucose, UA: NEGATIVE
Ketones, UA: NEGATIVE
Nitrite, UA: NEGATIVE
Protein, UA: NEGATIVE
Urobilinogen, UA: NEGATIVE
pH, UA: 5

## 2015-12-02 NOTE — Progress Notes (Signed)
GYNECOLOGY  VISIT   HPI: 40 y.o.   Married  Caucasian  female   [phone] with Patient's last menstrual period was 10/27/2015 (exact date).   here for pain in RLQ pain to mid lower pelvic pain. Patient doesn't feel she is emptying bladder well.   Patient is 2 weeks post HYSTERECTOMY TOTAL LAPAROSCOPIC (N/A Abdomen) [[id number] CPT (R)] LAPAROSCOPIC BILATERAL SALPINGECTOMY poss BSO (Bilateral Abdomen)CYSTOSCOPY (N/A ).  Feels like she is not emptying her bladder well.   Then developed pain in her right lower quadrant.  No increased activity.  Tramadol helped pain when it was increased in the right lower quadrant following a sneeze.  No dysuria.  Voiding less.  Pink spotting following her episode of pain following sneeze.   Urine GEZ:MOQHU RBCs, Trace WBCs  GYNECOLOGIC HISTORY: Patient's last menstrual period was 10/27/2015 (exact date). Contraception:  Hysterectomy Menopausal hormone therapy:  none Last mammogram:  n/a Last pap smear:   2015 normal per patient        OB History    Gravida Para Term Preterm AB Living   4 4 4     4    SAB TAB Ectopic Multiple Live Births                     Patient Active Problem List   Diagnosis Date Noted  . Status post laparoscopic hysterectomy 11/17/2015  . Hematuria 09/05/2015  . UTI (lower urinary tract infection) 09/05/2015  . Urinary urgency 09/05/2015  . Bilateral low back pain without sciatica   . Essential hypertension     Past Medical History:  Diagnosis Date  . Abnormal uterine bleeding 2016, 06/2015  . Anemia    taking iron supplement  . Depression    when had a loss in family--fam hx of depression  . Hypertension   . RA (rheumatoid arthritis) ([place]) 2013   --trying to control with diet--followed by PCP  . Shortness of breath dyspnea    w/ activity ? d/t anemia  . TMJ dysfunction    bilateral  . Wears glasses     Past Surgical History:  Procedure Laterality Date  . broken foot Right 08/2014  . CYSTOSCOPY N/A  11/17/2015   Procedure: CYSTOSCOPY;  Surgeon: Nunzio Cobbs, MD;  Location: [facility];  Service: Gynecology;  Laterality: N/A;  . HYSTEROSCOPY W/D&C N/A 07/03/2015   Procedure: DILATATION AND CURETTAGE /HYSTEROSCOPY;  Surgeon: Nunzio Cobbs, MD;  Location: [facility];  Service: Gynecology;  Laterality: N/A;  . LAPAROSCOPIC BILATERAL SALPINGECTOMY Bilateral 11/17/2015   Procedure: LAPAROSCOPIC BILATERAL SALPINGECTOMY poss BSO;  Surgeon: Nunzio Cobbs, MD;  Location: [facility];  Service: Gynecology;  Laterality: Bilateral;  . LAPAROSCOPIC HYSTERECTOMY N/A 11/17/2015   Procedure: HYSTERECTOMY TOTAL LAPAROSCOPIC;  Surgeon: Nunzio Cobbs, MD;  Location: [facility];  Service: Gynecology;  Laterality: N/A;    Current Outpatient Prescriptions  Medication Sig Dispense Refill  . hydrochlorothiazide (HYDRODIURIL) 25 MG tablet Take 1 tablet by mouth daily.    Marland Kitchen ibuprofen (ADVIL,MOTRIN) 800 MG tablet Take 1 tablet (800 mg total) by mouth every 8 (eight) hours as needed for headache or moderate pain. 30 tablet 0  . Melatonin 5 MG CHEW Chew 2 capsules by mouth at bedtime as needed.    . Multiple Vitamin (MULTIVITAMIN WITH MINERALS) TABS tablet Take 1 tablet by mouth daily.    . traMADol (ULTRAM) 50 MG tablet Take 50 mg by mouth every 6 (  six) hours as needed.     No current facility-administered medications for this visit.      ALLERGIES: Review of patient's allergies indicates no known allergies.  Family History  Problem Relation Age of Onset  . Heart disease Father     dec age 8  . Hypertension Mother   . Hypertension Maternal Grandmother   . Stroke Maternal Grandmother   . Cancer Maternal Grandfather     Dec Glioblastoma  . Cancer Other     Dec Glioblastoma  . Cancer Maternal Aunt     dec brain ca--glioblastoma    Social History   Social History  . Marital status: Married    Spouse name: N/A  . Number of children: N/A  . Years of  education: N/A   Occupational History  . Not on file.   Social History Main Topics  . Smoking status: Former Smoker    Packs/day: 2.00    Types: Cigarettes    Quit date: 03/07/2006  . Smokeless tobacco: Never Used  . Alcohol use 1.2 - 1.8 oz/week    2 - 3 Standard drinks or equivalent per week     Comment: 3-5 glasses of wine/week of occ. marguerita  . Drug use: No  . Sexual activity: Not Currently    Partners: Male    Birth control/ protection: Other-see comments     Comment: husband with vasectomy   Other Topics Concern  . Not on file   Social History Narrative  . No narrative on file    ROS:  Pertinent items are noted in HPI.  PHYSICAL EXAMINATION:    BP 140/88 (BP Location: Right Arm, Patient Position: Sitting, Cuff Size: Normal)   Pulse 76   Ht 5' 7"  (1.702 m)   Wt 205 lb 6.4 oz (93.2 kg)   LMP 10/27/2015 (Exact Date)   BMI 32.17 kg/m     General appearance: alert, cooperative and appears stated age   Abdomen: incisions intact, soft, non-tender, no masses,  no organomegaly    Pelvic: External genitalia:  no lesions              Urethra:  normal appearing urethra with no masses, tenderness or lesions              Bartholins and Skenes: normal                 Vagina: normal appearing vagina with normal color and discharge, no lesions.  Cuff intact.  No blood.              Cervix:  absent                Bimanual Exam:  Uterus:   absent              Adnexa: no mass, fullness, tenderness   Sterile cath with betadine -  Verbal consent. 65 cc noted of clear yellow urine.    Negative urine dip.   Chaperone was present for exam.  ASSESSMENT  Urinary retention. Not confirmed by cath now.  No UTI. RLQ pain. I suspect this is related to incisional pulling with a sneeze.  No evidence of incisional hernia or cuff dehiscence.   PLAN  Reassurance given.  Continue decreased activity.  Follow up for 6 week post op visit.  Is applying for a new job with city of  [place] and may need a letter so that she may attend an interview as she is currently on FMLA.  She  will let us know if she needs something further from [place].    An After Visit Summary was printed and given to the patient.  _15_____ minutes face to face time of which over 50% was spent in counseling.

## 2015-12-28 ENCOUNTER — Ambulatory Visit (INDEPENDENT_AMBULATORY_CARE_PROVIDER_SITE_OTHER): Payer: Commercial Managed Care - HMO | Admitting: Obstetrics and Gynecology

## 2015-12-28 ENCOUNTER — Encounter: Payer: Self-pay | Admitting: Obstetrics and Gynecology

## 2015-12-28 ENCOUNTER — Telehealth: Payer: Self-pay | Admitting: Obstetrics and Gynecology

## 2015-12-28 VITALS — BP 140/80 | HR 88 | Ht 67.0 in | Wt 211.4 lb

## 2015-12-28 DIAGNOSIS — Z9889 Other specified postprocedural states: Secondary | ICD-10-CM

## 2015-12-28 NOTE — Telephone Encounter (Signed)
Dr. Quincy Simmonds, patient requesting return to work note. Was in for post-op OV today, any restrictions? OK to return to work?

## 2015-12-28 NOTE — Telephone Encounter (Signed)
Patient was seen today for her post op and failed to get a return to work note from [employer]. Patient needs to turn in this note tomorrow before starting work 01/02/16.

## 2015-12-28 NOTE — Progress Notes (Signed)
GYNECOLOGY  VISIT   HPI: 40 y.o.   Married  Caucasian  female   [phone] with Patient's last menstrual period was 10/27/2015 (exact date).   here for 6 week follow up  HYSTERECTOMY TOTAL LAPAROSCOPIC (N/A Abdomen) [[id number] CPT (R)] LAPAROSCOPIC BILATERAL SALPINGECTOMY poss BSO (Bilateral Abdomen) CYSTOSCOPY (N/A ).  Has gained weight during her post op recovery.  Not sleeping well.  Legs are feeling restless.  Taking Melatonin.   Not taking pain medication.  Normal bladder and bowel function.   Hgb 11.7 on 11/23/15.  GYNECOLOGIC HISTORY: Patient's last menstrual period was 10/27/2015 (exact date). Contraception:  Hysterectomy Menopausal hormone therapy:  none Last mammogram:  n/a Last pap smear:   2015 normal per patient        OB History    Gravida Para Term Preterm AB Living   4 4 4     4    SAB TAB Ectopic Multiple Live Births                     Patient Active Problem List   Diagnosis Date Noted  . Status post laparoscopic hysterectomy 11/17/2015  . Hematuria 09/05/2015  . UTI (lower urinary tract infection) 09/05/2015  . Urinary urgency 09/05/2015  . Bilateral low back pain without sciatica   . Essential hypertension     Past Medical History:  Diagnosis Date  . Abnormal uterine bleeding 2016, 06/2015  . Anemia    taking iron supplement  . Depression    when had a loss in family--fam hx of depression  . Hypertension   . RA (rheumatoid arthritis) ([place]) 2013   --trying to control with diet--followed by PCP  . Shortness of breath dyspnea    w/ activity ? d/t anemia  . TMJ dysfunction    bilateral  . Wears glasses     Past Surgical History:  Procedure Laterality Date  . broken foot Right 08/2014  . CYSTOSCOPY N/A 11/17/2015   Procedure: CYSTOSCOPY;  Surgeon: Nunzio Cobbs, MD;  Location: [facility];  Service: Gynecology;  Laterality: N/A;  . HYSTEROSCOPY W/D&C N/A 07/03/2015   Procedure: DILATATION AND CURETTAGE /HYSTEROSCOPY;  Surgeon: Nunzio Cobbs, MD;  Location[facility];  Service: Gynecology;  Laterality: N/A;  . LAPAROSCOPIC BILATERAL SALPINGECTOMY Bilateral 11/17/2015   Procedure: LAPAROSCOPIC BILATERAL SALPINGECTOMY poss BSO;  Surgeon: Nunzio Cobbs, MD;  Location: [facility];  Service: Gynecology;  Laterality: Bilateral;  . LAPAROSCOPIC HYSTERECTOMY N/A 11/17/2015   Procedure: HYSTERECTOMY TOTAL LAPAROSCOPIC;  Surgeon: Nunzio Cobbs, MD;  Location: [facility];  Service: Gynecology;  Laterality: N/A;    Current Outpatient Prescriptions  Medication Sig Dispense Refill  . hydrochlorothiazide (HYDRODIURIL) 25 MG tablet Take 1 tablet by mouth daily.    . Melatonin 5 MG CHEW Chew 2 capsules by mouth at bedtime as needed.    . Multiple Vitamin (MULTIVITAMIN WITH MINERALS) TABS tablet Take 1 tablet by mouth daily.     No current facility-administered medications for this visit.      ALLERGIES: Review of patient's allergies indicates no known allergies.  Family History  Problem Relation Age of Onset  . Heart disease Father     dec age 78  . Hypertension Mother   . Hypertension Maternal Grandmother   . Stroke Maternal Grandmother   . Cancer Maternal Grandfather     Dec Glioblastoma  . Cancer Other     Dec Glioblastoma  . Cancer  Maternal Aunt     dec brain ca--glioblastoma    Social History   Social History  . Marital status: Married    Spouse name: N/A  . Number of children: N/A  . Years of education: N/A   Occupational History  . Not on file.   Social History Main Topics  . Smoking status: Former Smoker    Packs/day: 2.00    Types: Cigarettes    Quit date: 03/07/2006  . Smokeless tobacco: Never Used  . Alcohol use 1.2 - 1.8 oz/week    2 - 3 Standard drinks or equivalent per week     Comment: 3-5 glasses of wine/week of occ. marguerita  . Drug use: No  . Sexual activity: Not Currently    Partners: Male    Birth control/ protection: Other-see comments     Comment:  husband with vasectomy   Other Topics Concern  . Not on file   Social History Narrative  . No narrative on file    ROS:  Pertinent items are noted in HPI.  PHYSICAL EXAMINATION:    BP 140/80 (BP Location: Right Arm, Patient Position: Sitting, Cuff Size: Normal)   Pulse 88   Ht 5' 7"  (1.702 m)   Wt 211 lb 6.4 oz (95.9 kg)   LMP 10/27/2015 (Exact Date)   BMI 33.11 kg/m     General appearance: alert, cooperative and appears stated age   Abdomen: incisions intact, soft, non-tender, no masses,  no organomegaly    Pelvic: External genitalia:  no lesions              Urethra:  normal appearing urethra with no masses, tenderness or lesions              Bartholins and Skenes: normal                 Vagina: normal appearing vagina with normal color and discharge, no lesions              Cervix: absent.  Cuff intact.                Bimanual Exam:  Uterus: absent              Adnexa: no mass, fullness, tenderness          Chaperone was present for exam.  ASSESSMENT  Status post laparoscopic TLH/bilateral salpingectomy.  Doing well post op.  PLAN  Wait to do heavy physical activity and have intercourse for 2 more weeks.  Can increase physical activity overall.  Call for any problems.  [facility].  Return for yearly visits and prn.   An After Visit Summary was printed and given to the patient.

## 2015-12-28 NOTE — Telephone Encounter (Signed)
OK to return to work.  No restrictions unless she is required to lift over 25 pounds.

## 2015-12-28 NOTE — Telephone Encounter (Signed)
Left message to call Sharee Pimple at [phone].

## 2015-12-29 NOTE — Telephone Encounter (Signed)
Spoke with patient. Patient to pick up letter from office. Advised will be ready for pick up at her convenience. Patient is agreeable.   Routing to provider for final review. Patient is agreeable to disposition. Will close encounter.

## 2016-03-11 DIAGNOSIS — Z719 Counseling, unspecified: Secondary | ICD-10-CM | POA: Diagnosis not present

## 2016-03-11 DIAGNOSIS — J069 Acute upper respiratory infection, unspecified: Secondary | ICD-10-CM | POA: Diagnosis not present

## 2016-03-13 DIAGNOSIS — J40 Bronchitis, not specified as acute or chronic: Secondary | ICD-10-CM | POA: Diagnosis not present

## 2016-03-18 DIAGNOSIS — Z719 Counseling, unspecified: Secondary | ICD-10-CM | POA: Diagnosis not present

## 2016-03-25 DIAGNOSIS — Z719 Counseling, unspecified: Secondary | ICD-10-CM | POA: Diagnosis not present

## 2016-04-01 DIAGNOSIS — Z719 Counseling, unspecified: Secondary | ICD-10-CM | POA: Diagnosis not present

## 2016-04-15 DIAGNOSIS — Z719 Counseling, unspecified: Secondary | ICD-10-CM | POA: Diagnosis not present

## 2016-05-30 DIAGNOSIS — I10 Essential (primary) hypertension: Secondary | ICD-10-CM | POA: Diagnosis not present

## 2016-05-30 DIAGNOSIS — R42 Dizziness and giddiness: Secondary | ICD-10-CM | POA: Diagnosis not present

## 2016-06-02 DIAGNOSIS — I10 Essential (primary) hypertension: Secondary | ICD-10-CM | POA: Diagnosis not present

## 2016-07-19 ENCOUNTER — Encounter: Payer: Self-pay | Admitting: Obstetrics and Gynecology

## 2016-07-19 DIAGNOSIS — I10 Essential (primary) hypertension: Secondary | ICD-10-CM | POA: Diagnosis not present

## 2016-09-12 ENCOUNTER — Encounter: Payer: Self-pay | Admitting: Obstetrics and Gynecology

## 2016-09-12 ENCOUNTER — Telehealth: Payer: Self-pay | Admitting: Obstetrics and Gynecology

## 2016-09-12 NOTE — Telephone Encounter (Addendum)
Unable to leave message regarding upcoming appointment has been canceled and needs to be rescheduled.Letter mailed.

## 2016-10-17 DIAGNOSIS — M25572 Pain in left ankle and joints of left foot: Secondary | ICD-10-CM | POA: Diagnosis not present

## 2016-10-24 IMAGING — CT CT RENAL STONE PROTOCOL
2 of 3 series · 16 of 46 positions shown, 18 images · non-contrast
Comparison: None.

CLINICAL DATA: 40-year-old female with flank and lower back pain.

EXAM:
CT ABDOMEN AND PELVIS WITHOUT CONTRAST
TECHNIQUE: Multidetector CT imaging of the abdomen and pelvis was performed
following the standard protocol without IV contrast.

[Series 3: coronal · coronal · 0.74mm/px · 3 of 190 slices shown]
[im 64/190  soft-tissue]
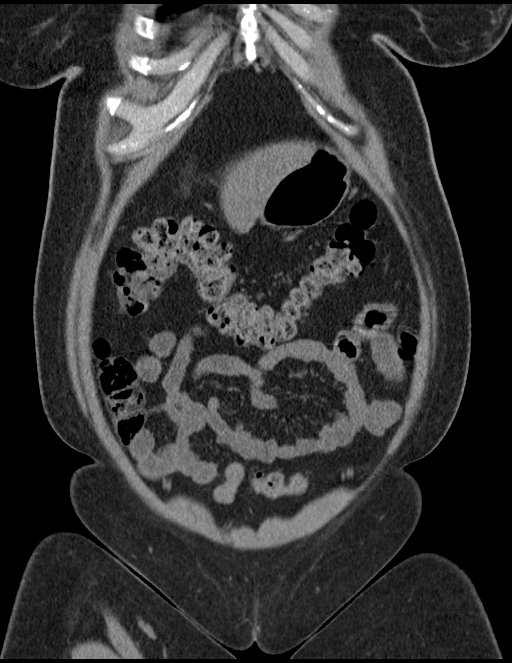
[im 85/190  soft-tissue]
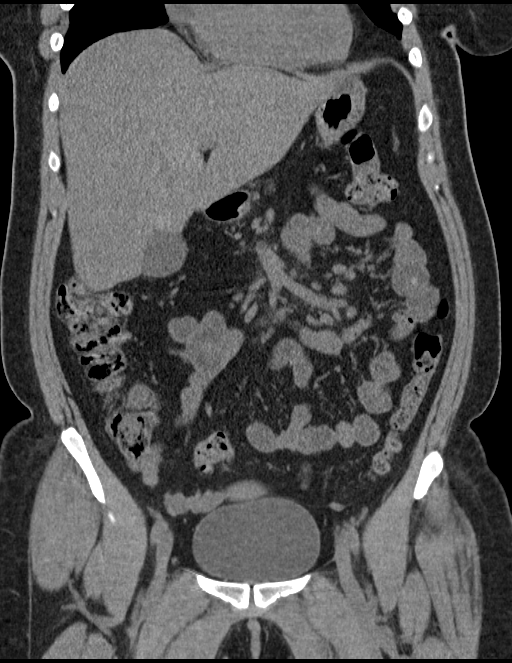
[im 106/190  soft-tissue]
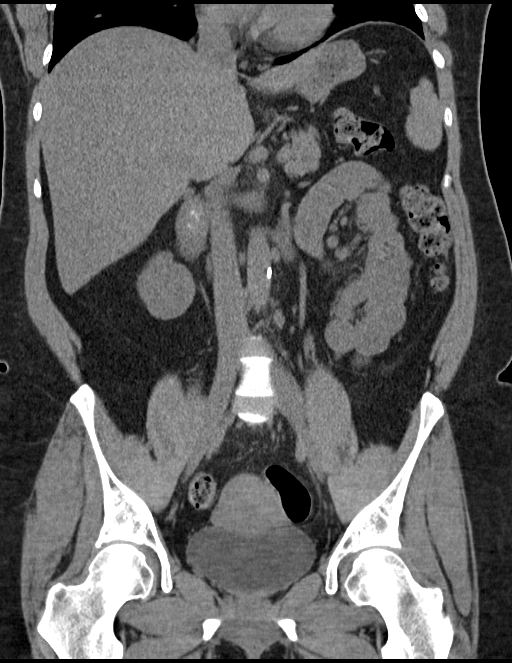

[Series 6: lung · axial · 0.86mm/px · z∈[+1384,+1482]mm · 13 of 57 slices shown, 15 images]
[im 4/57  soft-tissue]
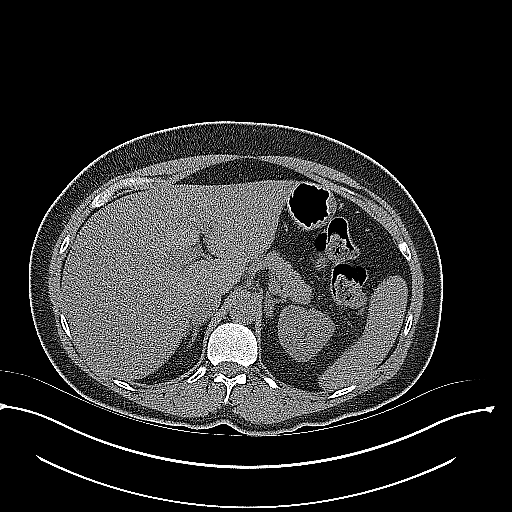
[im 4/57  bone]
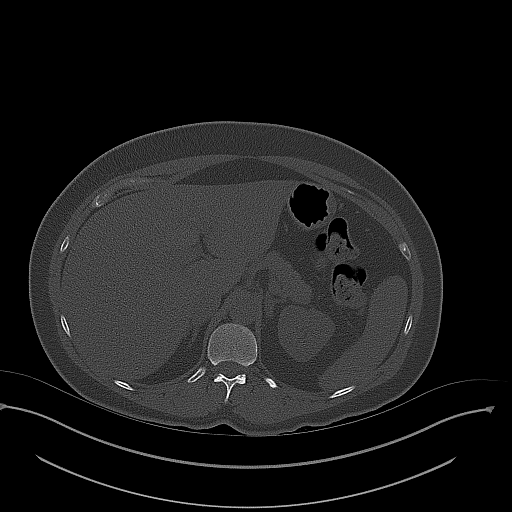
[im 8/57  soft-tissue]
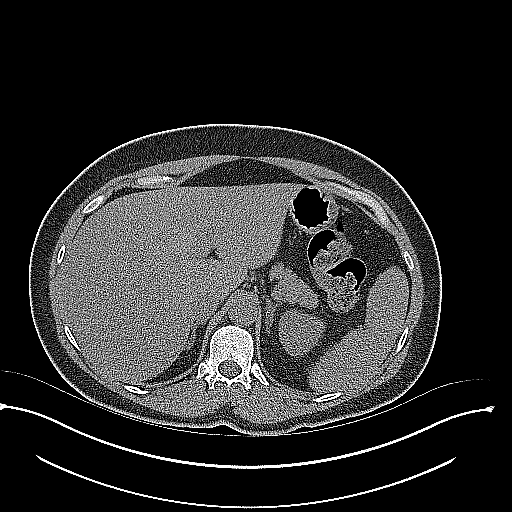
[im 11/57  soft-tissue]
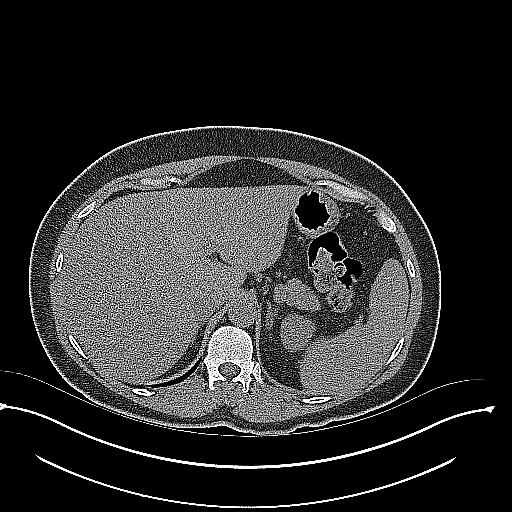
[im 17/57  soft-tissue]
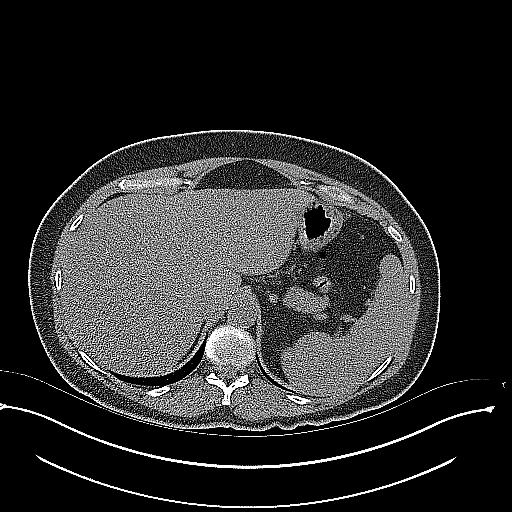
[im 20/57  soft-tissue]
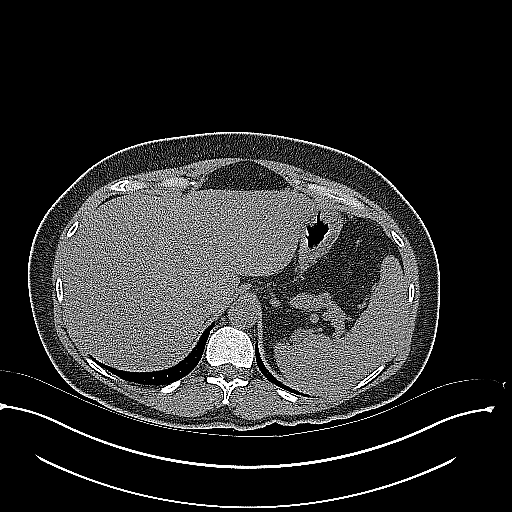
[im 24/57  soft-tissue]
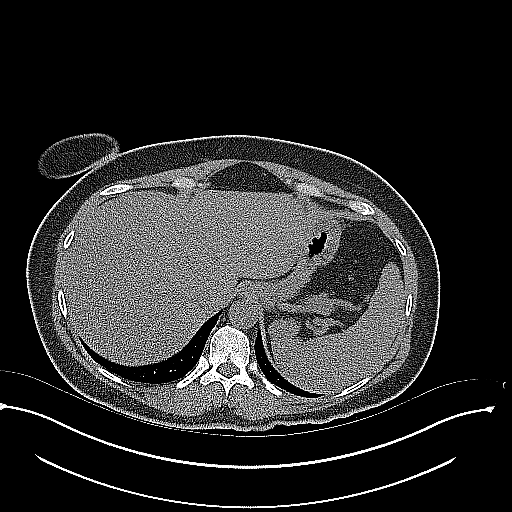
[im 29/57  soft-tissue]
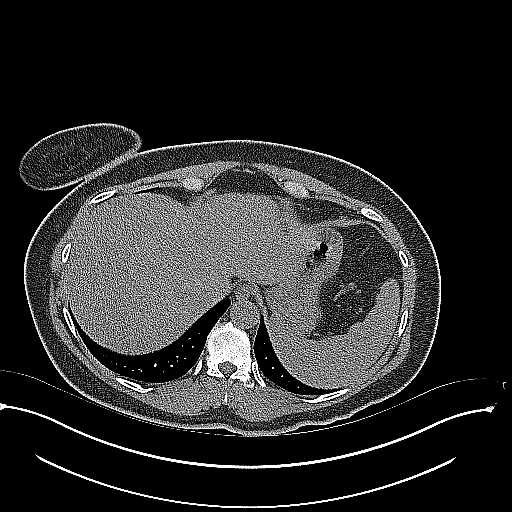
[im 33/57  soft-tissue]
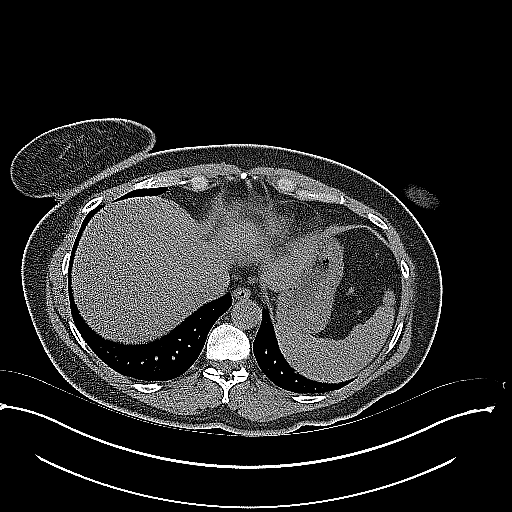
[im 37/57  soft-tissue]
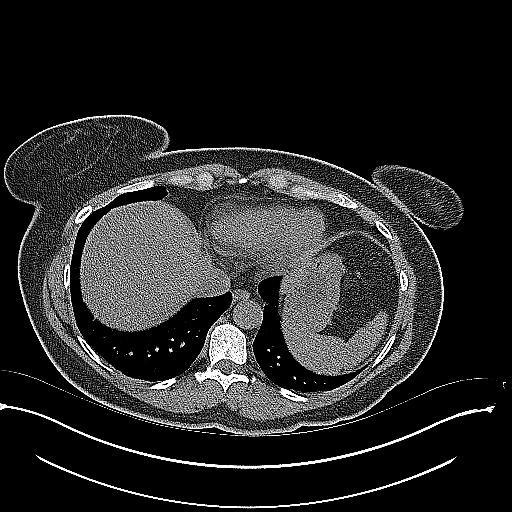
[im 37/57  bone]
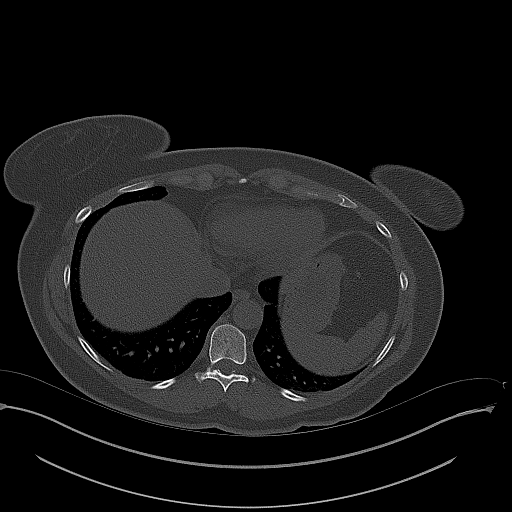
[im 40/57  soft-tissue]
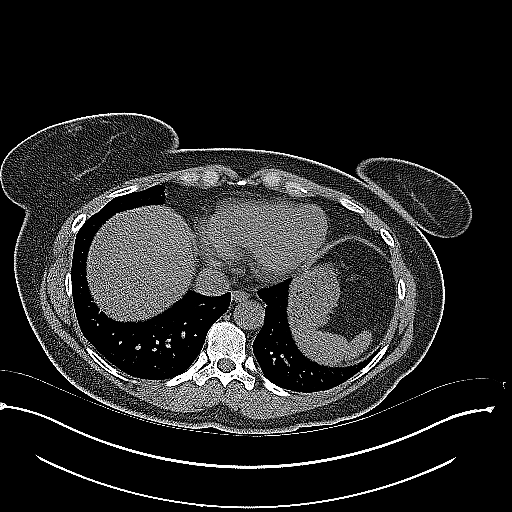
[im 46/57  soft-tissue]
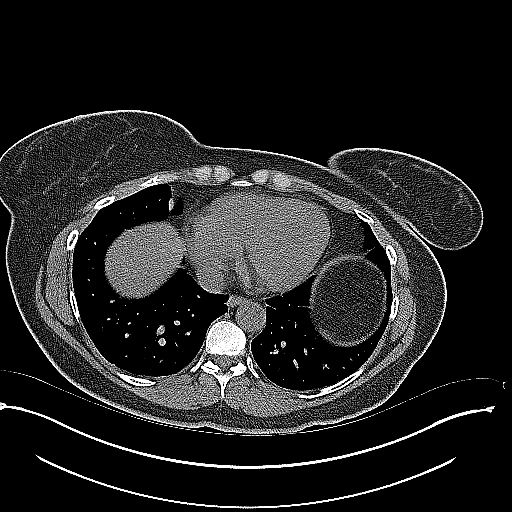
[im 49/57  soft-tissue]
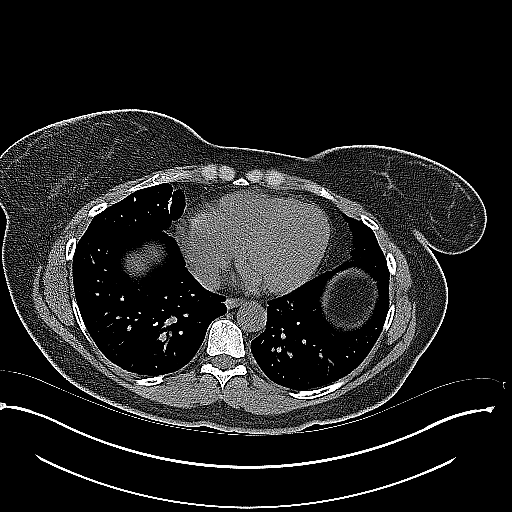
[im 53/57  soft-tissue]
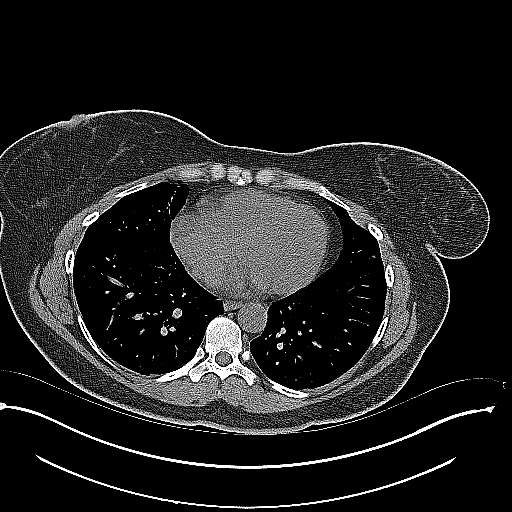

[16 of 46 positions shown; findings below may reference images not displayed]

FINDINGS: Evaluation of this exam is limited in the absence of intravenous
contrast.

The visualized lung bases are clear. No intra-abdominal free air or
free fluid. Diffuse fatty infiltration of the liver with focal area
of sparing adjacent to the gallbladder fossa. The gallbladder,
pancreas, spleen, adrenal glands, kidneys, visualized ureters, and
urinary bladder appear unremarkable. The uterus is anteverted and
grossly unremarkable. There is a 2 cm left ovarian hypodensity which
may represent normal ovarian tissue versus a dominant follicle/
cyst. Ultrasound may provide better evaluation of the pelvic
structures if clinically indicated.

There is moderate stool throughout the colon. No evidence of bowel
obstruction or active inflammation. Normal appendix.

The abdominal aorta and IVC are grossly unremarkable on this
noncontrast study. No portal venous gas identified. There is mild
haziness of the mesentery with multiple top-normal lymph nodes with
a "misty mesentery" appearance. This finding is nonspecific but may
be related to underlying inflammatory/infectious etiology. There is
no adenopathy. There is a small fat containing umbilical hernia. The
osseous structures are intact.
IMPRESSION: No acute intra-abdominal pelvic pathology. Specifically there is no
hydronephrosis or nephrolithiasis.

Small scattered mesenteric lymph nodes, nonspecific.

## 2016-11-17 ENCOUNTER — Ambulatory Visit: Payer: Commercial Managed Care - HMO | Admitting: Obstetrics and Gynecology

## 2017-03-16 DIAGNOSIS — R0789 Other chest pain: Secondary | ICD-10-CM | POA: Diagnosis not present

## 2017-03-16 DIAGNOSIS — R002 Palpitations: Secondary | ICD-10-CM | POA: Diagnosis not present

## 2017-03-30 DIAGNOSIS — R002 Palpitations: Secondary | ICD-10-CM | POA: Diagnosis not present

## 2017-03-30 DIAGNOSIS — R079 Chest pain, unspecified: Secondary | ICD-10-CM | POA: Diagnosis not present

## 2017-03-30 DIAGNOSIS — I10 Essential (primary) hypertension: Secondary | ICD-10-CM | POA: Diagnosis not present

## 2017-03-30 DIAGNOSIS — R0789 Other chest pain: Secondary | ICD-10-CM | POA: Diagnosis not present

## 2017-03-31 DIAGNOSIS — I10 Essential (primary) hypertension: Secondary | ICD-10-CM | POA: Diagnosis not present

## 2017-05-02 DIAGNOSIS — I119 Hypertensive heart disease without heart failure: Secondary | ICD-10-CM | POA: Diagnosis not present

## 2017-05-09 ENCOUNTER — Ambulatory Visit (HOSPITAL_COMMUNITY)
Admission: RE | Admit: 2017-05-09 | Discharge: 2017-05-09 | Disposition: A | Discharge status: Home / Self Care | Payer: 59 | Source: Ambulatory Visit | Attending: Vascular Surgery | Admitting: Vascular Surgery

## 2017-05-09 ENCOUNTER — Other Ambulatory Visit: Payer: Self-pay | Admitting: Cardiology

## 2017-05-09 DIAGNOSIS — I119 Hypertensive heart disease without heart failure: Secondary | ICD-10-CM

## 2017-06-13 DIAGNOSIS — I119 Hypertensive heart disease without heart failure: Secondary | ICD-10-CM | POA: Diagnosis not present

## 2017-06-16 ENCOUNTER — Other Ambulatory Visit: Payer: Self-pay | Admitting: Cardiology

## 2017-06-16 DIAGNOSIS — I10 Essential (primary) hypertension: Secondary | ICD-10-CM

## 2017-06-23 ENCOUNTER — Ambulatory Visit (HOSPITAL_COMMUNITY)
Admission: RE | Admit: 2017-06-23 | Discharge: 2017-06-23 | Disposition: A | Discharge status: Home / Self Care | Payer: 59 | Source: Ambulatory Visit | Attending: Cardiology | Admitting: Cardiology

## 2017-06-23 DIAGNOSIS — I700 Atherosclerosis of aorta: Secondary | ICD-10-CM | POA: Diagnosis not present

## 2017-06-23 DIAGNOSIS — I10 Essential (primary) hypertension: Secondary | ICD-10-CM

## 2017-06-23 MED ORDER — IOPAMIDOL (ISOVUE-370) INJECTION 76%
INTRAVENOUS | Status: AC
  Filled 2017-06-23: qty 100

## 2017-06-23 MED ORDER — IOPAMIDOL (ISOVUE-370) INJECTION 76%
100.0000 mL | Freq: Once | INTRAVENOUS | Status: AC | PRN
  Administered 2017-06-23: 100 mL via INTRAVENOUS

## 2017-07-22 DIAGNOSIS — J00 Acute nasopharyngitis [common cold]: Secondary | ICD-10-CM | POA: Diagnosis not present

## 2017-07-24 DIAGNOSIS — N951 Menopausal and female climacteric states: Secondary | ICD-10-CM | POA: Diagnosis not present

## 2017-07-29 DIAGNOSIS — J0110 Acute frontal sinusitis, unspecified: Secondary | ICD-10-CM | POA: Diagnosis not present

## 2017-07-29 DIAGNOSIS — J0100 Acute maxillary sinusitis, unspecified: Secondary | ICD-10-CM | POA: Diagnosis not present

## 2018-08-12 IMAGING — CT CT ANGIO ABDOMEN
3 of 14 series · 11 of 46 positions shown, 17 images · IV contrast (ISOVUE 370)
Comparison: CT abdomen and pelvis - 09/05/2015

CLINICAL DATA: Hypertension for the past 2 years. Evaluate for
renal artery stenosis.

EXAM:
CT ANGIOGRAPHY ABDOMEN
TECHNIQUE: Multidetector CT imaging of the abdomen was performed using the
standard protocol during bolus administration of intravenous
contrast. Multiplanar reconstructed images and MIPs were obtained
and reviewed to evaluate the vascular anatomy.
CONTRAST:  100mL 9NQ11N-4GO IOPAMIDOL (9NQ11N-4GO) INJECTION 76%

[Series 4: axial arterial · axial · arterial · 0.80mm/px · z∈[-236,-104]mm · 6 of 79 slices shown]
[im 9/79  soft-tissue]
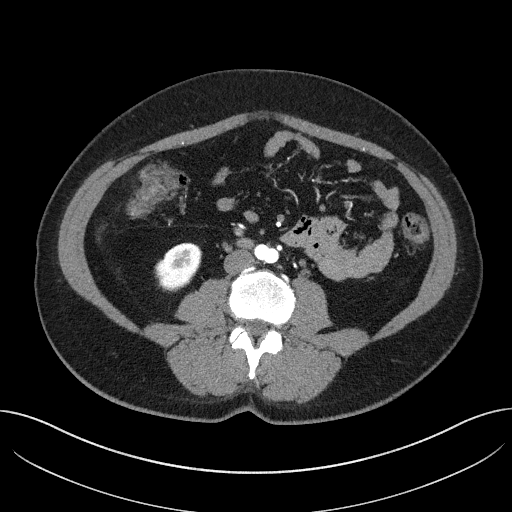
[im 18/79  soft-tissue]
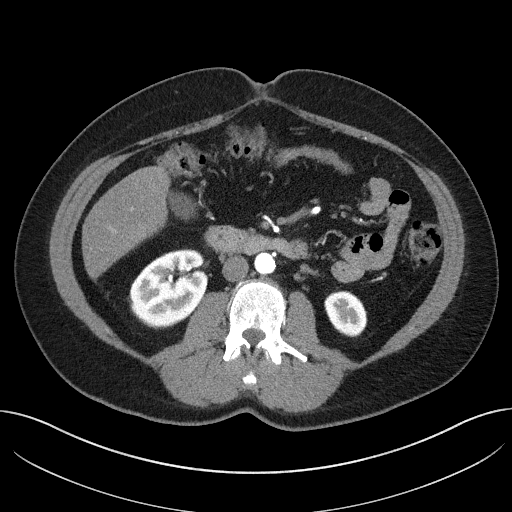
[im 27/79  soft-tissue]
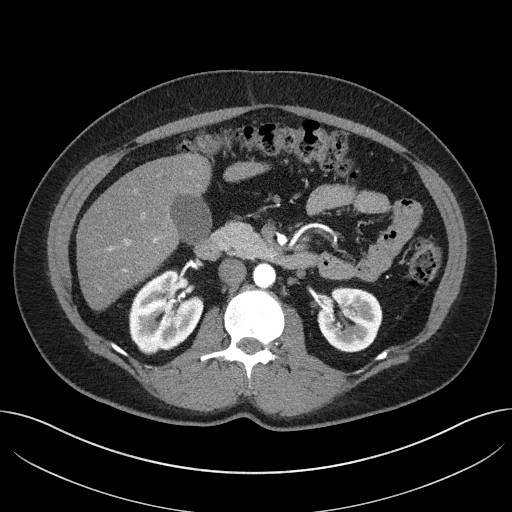
[im 35/79  soft-tissue]
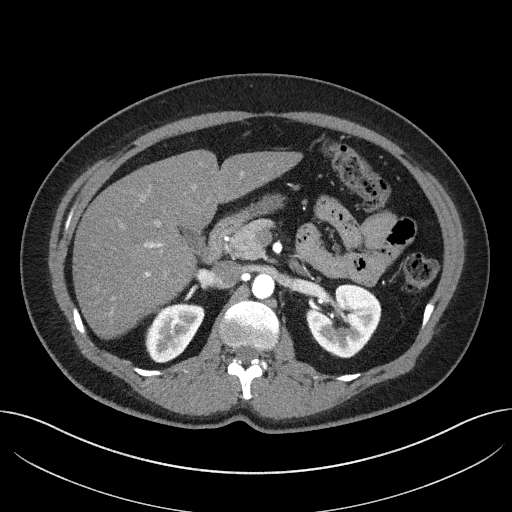
[im 44/79  soft-tissue]
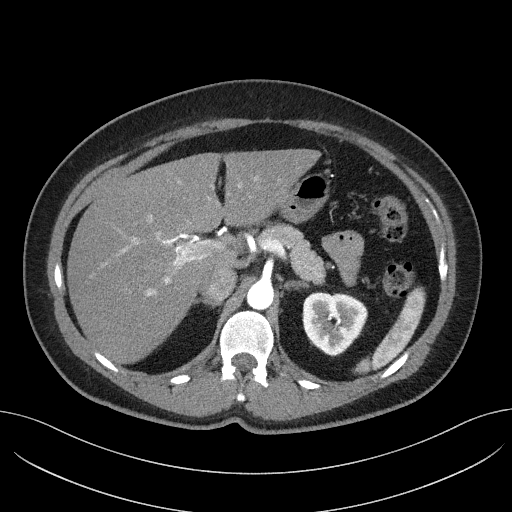
[im 53/79  soft-tissue]
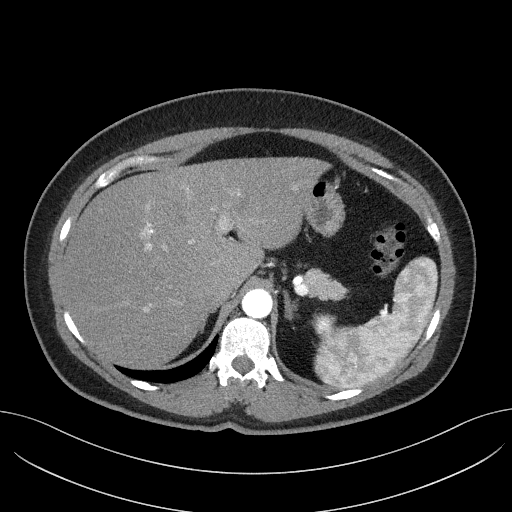

[Series 11: axial venous · axial · portal-venous · 0.80mm/px · z∈[-241,-81]mm · 4 of 54 slices shown, 9 images]
[im 11/54  soft-tissue]
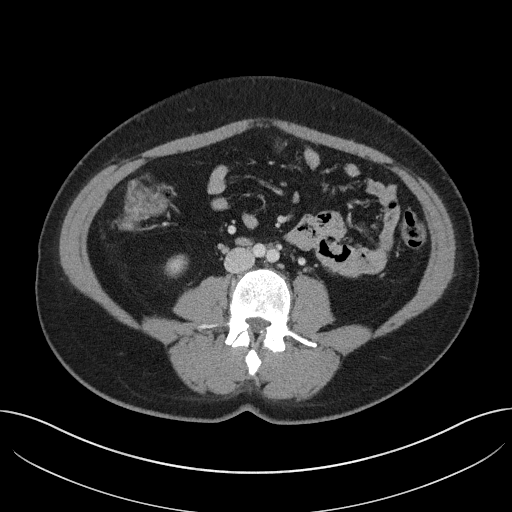
[im 11/54  lung]
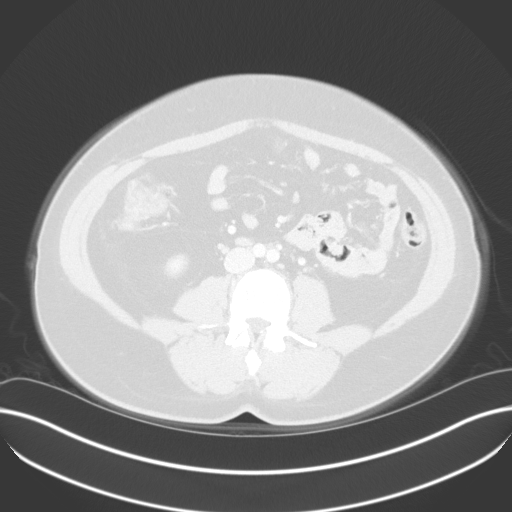
[im 11/54  bone]
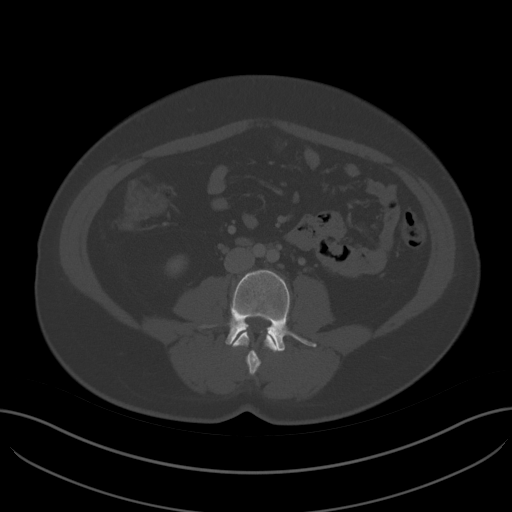
[im 22/54  soft-tissue]
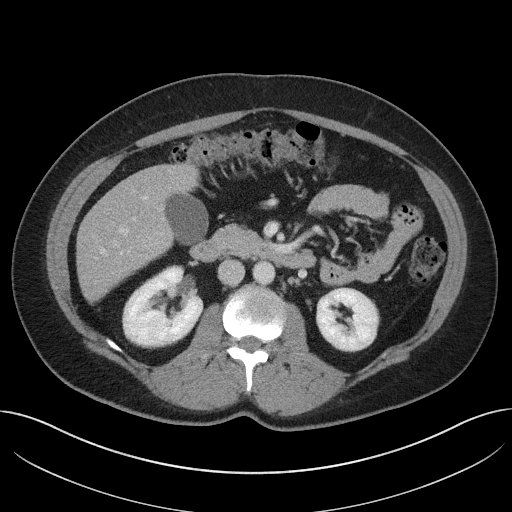
[im 22/54  lung]
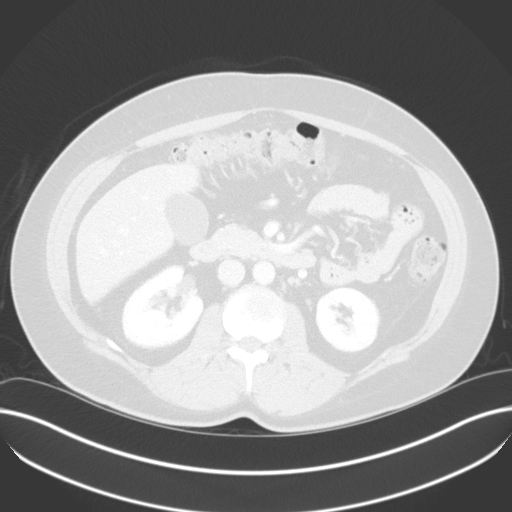
[im 32/54  soft-tissue]
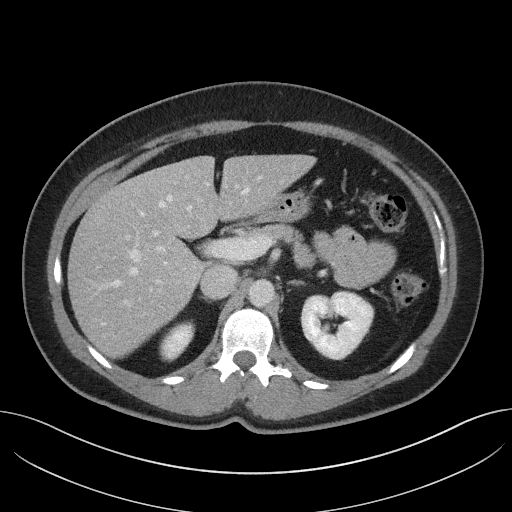
[im 32/54  lung]
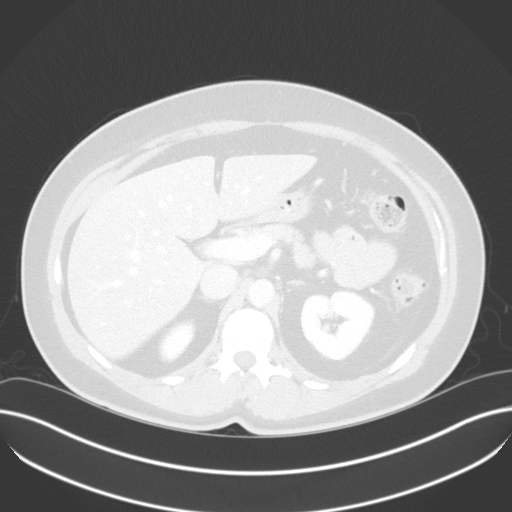
[im 43/54  soft-tissue]
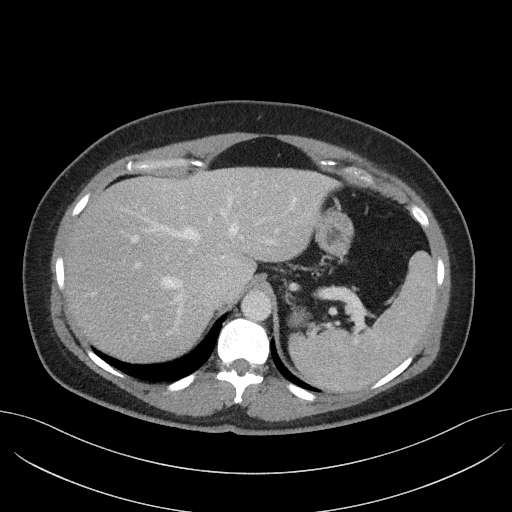
[im 43/54  lung]
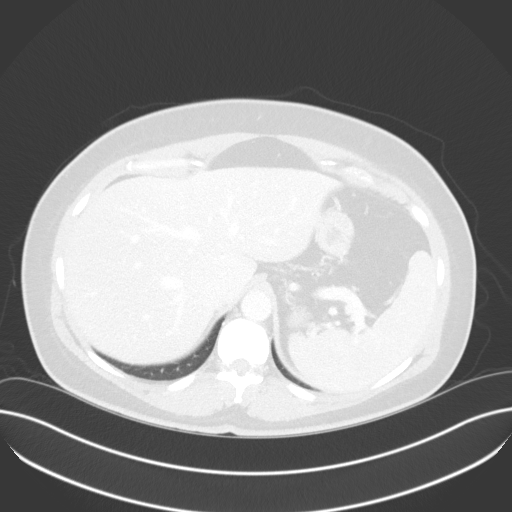

[Series 15: coronal venous · coronal · portal-venous · 0.54mm/px · 1 of 101 slices shown, 2 images]
[im 51/101  soft-tissue]
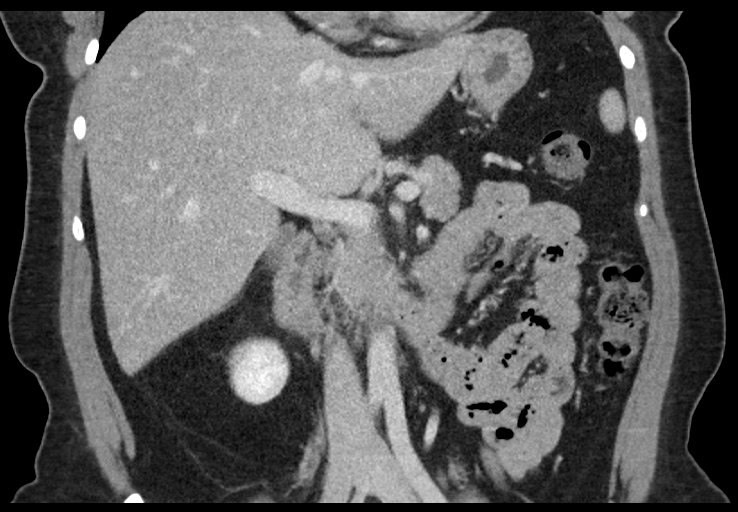
[im 51/101  bone]
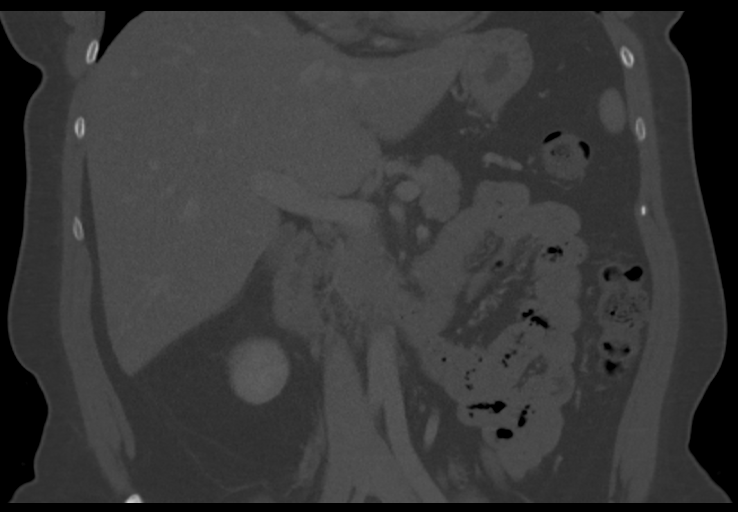

[11 of 46 positions shown; findings below may reference images not displayed]

FINDINGS: VASCULAR

Aorta: There is a minimal amount of mixed calcified and noncalcified
atherosclerotic plaque within a normal caliber abdominal aorta, not
resulting in hemodynamically significant stenosis. No abdominal
aortic dissection or periaortic stranding.

Celiac: Widely patent without hemodynamically significant stenosis.
Conventional branching pattern.

SMA: Widely patent without hemodynamically significant narrowing.
Conventional branching pattern.

Renals: Note is made of a duplicated left renal artery which
supplies the inferior pole the left kidney. Solitary right-sided
renal artery. Both dominant renal arteries are widely patent without
hemodynamically significant narrowing. No vessel irregularity to
suggest FMD.

IMA: Remains patent throughout its imaged course.

Veins: The IVC and bilateral renal veins appear widely patent.

Review of the MIP images confirms the above findings.

NON-VASCULAR

Lower chest: Limited visualization of lower thorax demonstrates
minimal dependent subpleural ground-glass atelectasis. No focal
airspace opacities. No pleural effusion. Normal heart size. No
pericardial effusion.

Hepatobiliary: Normal hepatic contour. No discrete hepatic lesions.
Normal appearance of the gallbladder given degree distention. No
radiopaque gallstones. No intra or extrahepatic biliary ductal
dilatation. No ascites.

Pancreas: Normal appearance of the pancreas

Spleen: Normal appearance of the spleen

Adrenals/Urinary Tract: There is symmetric enhancement of the
bilateral kidneys. No definite renal stones this postcontrast
examination. No discrete renal lesions. No urine obstruction or
perinephric stranding.

There is mild thickening involving the medial limb of the left
adrenal gland without discrete nodule. Normal appearance of the
right adrenal gland. The urinary bladder was not imaged.

Stomach/Bowel: Moderate colonic stool burden without evidence of
enteric obstruction.

Lymphatic: No bulky retroperitoneal porta hepatis or mesenteric
lymphadenopathy.

Other: Tiny mesenteric fat containing periumbilical hernia.

Musculoskeletal: No acute or aggressive osseous abnormalities.
IMPRESSION: 1. No explanation for patient's hypertension. Specifically, no
evidence of renal artery stenosis or FMD.
2. Minimal amount of atherosclerotic plaque with a normal caliber
abdominal aorta. Aortic Atherosclerosis ([id number]).

## 2019-09-23 ENCOUNTER — Ambulatory Visit (INDEPENDENT_AMBULATORY_CARE_PROVIDER_SITE_OTHER): Payer: Commercial Managed Care - HMO | Admitting: Family Medicine

## 2019-10-07 ENCOUNTER — Ambulatory Visit (INDEPENDENT_AMBULATORY_CARE_PROVIDER_SITE_OTHER): Payer: Commercial Managed Care - HMO | Admitting: Family Medicine

## 2020-03-03 ENCOUNTER — Other Ambulatory Visit: Payer: 59

## 2020-04-21 ENCOUNTER — Other Ambulatory Visit: Payer: Self-pay

## 2020-04-21 ENCOUNTER — Ambulatory Visit: Payer: 59 | Admitting: Family Medicine

## 2020-04-21 VITALS — BP 138/94 | Ht 67.0 in | Wt 230.0 lb

## 2020-04-21 DIAGNOSIS — M722 Plantar fascial fibromatosis: Secondary | ICD-10-CM

## 2020-04-21 NOTE — Patient Instructions (Signed)
We will contact you when the shockwave machine is back from the [place] people at [facility]. These are the typical plantar fascia instructions: Take tylenol and/or aleve as needed for pain  Plantar fascia stretch for 20-30 seconds (do 3 of these) in morning Lowering/raise on a step exercises 3 x 10 once or twice a day - this is very important for long term recovery. Can add heel walks, toe walks forward and backward as well Ice heel for 15 minutes as needed. Avoid flat shoes/barefoot walking as much as possible. Arch straps have been shown to help with pain. Continue with the inserts. Steroid injection is a consideration for short term pain relief if you are struggling. Physical therapy is also an option.

## 2020-04-22 ENCOUNTER — Encounter: Payer: Self-pay | Admitting: Family Medicine

## 2020-04-22 NOTE — Progress Notes (Signed)
PCP: Kathyrn Lass, MD  Subjective:   HPI: Patient is a 45 y.o. female here for discussion of shockwave treatment.  Patient has been seeing Dr. Sheppard Coil for chronic left plantar fasciitis. She's been doing exercises, icing, taking meloxicam, and wearing inserts and still with notable pain, minimal improvement. Not interested in steroid injection at this time. Interested in shockwave therapy.  Past Medical History:  Diagnosis Date  . Abnormal uterine bleeding 2016, 06/2015  . Anemia    taking iron supplement  . Depression    when had a loss in family--fam hx of depression  . Hypertension   . RA (rheumatoid arthritis) ([place]) 2013   --trying to control with diet--followed by PCP  . Shortness of breath dyspnea    w/ activity ? d/t anemia  . TMJ dysfunction    bilateral  . Wears glasses     Current Outpatient Medications on File Prior to Visit  Medication Sig Dispense Refill  . hydrochlorothiazide (HYDRODIURIL) 25 MG tablet Take 1 tablet by mouth daily.    . Melatonin 5 MG CHEW Chew 2 capsules by mouth at bedtime as needed.    . Multiple Vitamin (MULTIVITAMIN WITH MINERALS) TABS tablet Take 1 tablet by mouth daily.    . pantoprazole (PROTONIX) 40 MG tablet Take 40 mg by mouth daily.     No current facility-administered medications on file prior to visit.    Past Surgical History:  Procedure Laterality Date  . broken foot Right 08/2014  . CYSTOSCOPY N/A 11/17/2015   Procedure: CYSTOSCOPY;  Surgeon: Nunzio Cobbs, MD;  Location: [facility];  Service: Gynecology;  Laterality: N/A;  . HYSTEROSCOPY WITH D & C N/A 07/03/2015   Procedure: DILATATION AND CURETTAGE /HYSTEROSCOPY;  Surgeon: Nunzio Cobbs, MD;  Location: [facility];  Service: Gynecology;  Laterality: N/A;  . LAPAROSCOPIC BILATERAL SALPINGECTOMY Bilateral 11/17/2015   Procedure: LAPAROSCOPIC BILATERAL SALPINGECTOMY poss BSO;  Surgeon: Nunzio Cobbs, MD;  Location: [facility];  Service:  Gynecology;  Laterality: Bilateral;  . LAPAROSCOPIC HYSTERECTOMY N/A 11/17/2015   Procedure: HYSTERECTOMY TOTAL LAPAROSCOPIC;  Surgeon: Nunzio Cobbs, MD;  Location: [facility];  Service: Gynecology;  Laterality: N/A;    No Known Allergies  Social History   Socioeconomic History  . Marital status: Married    Spouse name: Not on file  . Number of children: Not on file  . Years of education: Not on file  . Highest education level: Not on file  Occupational History  . Not on file  Tobacco Use  . Smoking status: Former Smoker    Packs/day: 2.00    Types: Cigarettes    Quit date: 03/07/2006    Years since quitting: 14.1  . Smokeless tobacco: Never Used  Substance and Sexual Activity  . Alcohol use: Yes    Alcohol/week: 2.0 - 3.0 standard drinks    Types: 2 - 3 Standard drinks or equivalent per week    Comment: 3-5 glasses of wine/week of occ. marguerita  . Drug use: No  . Sexual activity: Not Currently    Partners: Male    Birth control/protection: Other-see comments    Comment: husband with vasectomy  Other Topics Concern  . Not on file  Social History Narrative  . Not on file   Social Determinants of Health   Financial Resource Strain: Not on file  Food Insecurity: Not on file  Transportation Needs: Not on file  Physical Activity: Not on file  Stress: Not on file  Social Connections: Not on file  Intimate Partner Violence: Not on file    Family History  Problem Relation Age of Onset  . Heart disease Father        dec age 28  . Hypertension Mother   . Hypertension Maternal Grandmother   . Stroke Maternal Grandmother   . Cancer Maternal Grandfather        Dec Glioblastoma  . Cancer Other        Dec Glioblastoma  . Cancer Maternal Aunt        dec brain ca--glioblastoma    BP (!) 138/94   Ht 5' 7"  (1.702 m)   Wt 230 lb (104.3 kg)   LMP 10/27/2015 (Exact Date) Comment: hysterectomy  BMI 36.02 kg/m   No flowsheet data found.  No flowsheet data  found.  Review of Systems: See HPI above.     Objective:  Physical Exam:  Gen: NAD, comfortable in exam room  Left foot/ankle: No gross deformity, swelling, ecchymoses FROM TTP medial calcaneus at plantar fascia insertion. Negative calcaneal squeeze. Thompsons test negative. NV intact distally.   Assessment & Plan:  1. Left plantar fasciitis - arch supports, arch binder, icing, meloxicam, home exercises.  Discussed risks and benefits of extracorporeal shockwave therapy for plantar fasciitis, number of treatments.  Machine is currently with Marcie Mowers - will call her when it returns.

## 2021-06-21 ENCOUNTER — Encounter: Payer: Self-pay | Admitting: Obstetrics and Gynecology

## 2021-06-23 ENCOUNTER — Ambulatory Visit: Payer: 59 | Admitting: Gastroenterology

## 2021-06-23 ENCOUNTER — Encounter: Payer: Self-pay | Admitting: Gastroenterology

## 2021-06-23 ENCOUNTER — Other Ambulatory Visit (INDEPENDENT_AMBULATORY_CARE_PROVIDER_SITE_OTHER): Payer: 59

## 2021-06-23 VITALS — BP 122/80 | HR 92 | Resp 98 | Ht 67.0 in | Wt 229.4 lb

## 2021-06-23 DIAGNOSIS — R7989 Other specified abnormal findings of blood chemistry: Secondary | ICD-10-CM | POA: Diagnosis not present

## 2021-06-23 LAB — HEPATIC FUNCTION PANEL
ALT: 33 U/L (ref 0–35)
AST: 33 U/L (ref 0–37)
Albumin: 4.5 g/dL (ref 3.5–5.2)
Alkaline Phosphatase: 65 U/L (ref 39–117)
Bilirubin, Direct: 0.1 mg/dL (ref 0.0–0.3)
Total Bilirubin: 0.6 mg/dL (ref 0.2–1.2)
Total Protein: 7.4 g/dL (ref 6.0–8.3)

## 2021-06-23 NOTE — Progress Notes (Signed)
?HPI: ?This is a very pleasant 46 year old woman who was referred to me by Dr. Helane Rima, MD for elevated liver tests ? ?She has never heard that she had elevated liver tests in the past.  Alcoholism runs in her family.  She has never had hepatitis or jaundice that she is aware of. ? ?Her husband died of cancer about 3 months ago and she started drinking quite a lot.  She was drinking vodka nightly about a bottle per week.  After she heard that her liver numbers were elevated she stopped drinking vodka and instead drinks a glass of wine every night 2. ? ?She does not consider herself an alcoholic ? ?She has been heavy for a long time.  BMI currently 35.  No significant weight gains or weight losses recently. ? ?She has had no serious abdominal pains. ? ? ?Old Data Reviewed: ?Blood work 08/2020: CBC and complete metabolic profile were both completely normal ?Blood work 05/2021: AST and ALT were 44 and 41 respectively, both of these are very slightly elevated.  The rest of her complete metabolic profile was normal. ? ? ? ?Review of systems: ?Pertinent positive and negative review of systems were noted in the above HPI section. All other review negative. ? ? ?Past Medical History:  ?Diagnosis Date  ? Abnormal uterine bleeding 2016, 06/2015  ? Anemia   ? taking iron supplement  ? Depression   ? when had a loss in family--fam hx of depression  ? Hypertension   ? RA (rheumatoid arthritis) ([place]) 2013  ? --trying to control with diet--followed by PCP  ? Shortness of breath dyspnea   ? w/ activity ? d/t anemia  ? TMJ dysfunction   ? bilateral  ? Wears glasses   ? ? ?Past Surgical History:  ?Procedure Laterality Date  ? ABDOMINAL HYSTERECTOMY    ? broken foot Right 08/06/2014  ? CYSTOSCOPY N/A 11/17/2015  ? Procedure: CYSTOSCOPY;  Surgeon: Nunzio Cobbs, MD;  Location: [facility];  Service: Gynecology;  Laterality: N/A;  ? HYSTEROSCOPY WITH D & C N/A 07/03/2015  ? Procedure: DILATATION AND CURETTAGE /HYSTEROSCOPY;  Surgeon:  Nunzio Cobbs, MD;  Location: [facility];  Service: Gynecology;  Laterality: N/A;  ? LAPAROSCOPIC BILATERAL SALPINGECTOMY Bilateral 11/17/2015  ? Procedure: LAPAROSCOPIC BILATERAL SALPINGECTOMY poss BSO;  Surgeon: Nunzio Cobbs, MD;  Location: [facility];  Service: Gynecology;  Laterality: Bilateral;  ? LAPAROSCOPIC HYSTERECTOMY N/A 11/17/2015  ? Procedure: HYSTERECTOMY TOTAL LAPAROSCOPIC;  Surgeon: Nunzio Cobbs, MD;  Location: [facility];  Service: Gynecology;  Laterality: N/A;  ? ? ?Current Outpatient Medications  ?Medication Instructions  ? escitalopram (LEXAPRO) 10 MG tablet escitalopram 10 mg tablet ? TAKE 1 TABLET BY MOUTH EVERY DAY  ? Eszopiclone 3 mg, Oral, Daily at bedtime, Take immediately before bedtime  ? hydrochlorothiazide (HYDRODIURIL) 25 MG tablet 1 tablet, Oral, Daily  ? ? ?Allergies as of 06/23/2021  ? (No Known Allergies)  ? ? ?Family History  ?Problem Relation Age of Onset  ? Hypertension Mother   ? Heart disease Father   ?     dec age 42  ? Cancer Maternal Aunt   ?     dec brain ca--glioblastoma  ? Hypertension Maternal Grandmother   ? Stroke Maternal Grandmother   ? Cancer Maternal Grandfather   ?     Dec Glioblastoma  ? Cancer Other   ?     Dec Glioblastoma  ? Colon  cancer Neg Hx   ? Colon polyps Neg Hx   ? Esophageal cancer Neg Hx   ? Pancreatic cancer Neg Hx   ? Stomach cancer Neg Hx   ? ? ?Social History  ? ?Socioeconomic History  ? Marital status: Married  ?  Spouse name: Not on file  ? Number of children: Not on file  ? Years of education: Not on file  ? Highest education level: Not on file  ?Occupational History  ? Not on file  ?Tobacco Use  ? Smoking status: Former  ?  Packs/day: 2.00  ?  Types: Cigarettes  ?  Quit date: 03/07/2006  ?  Years since quitting: 15.3  ? Smokeless tobacco: Never  ?Vaping Use  ? Vaping Use: Every day  ?Substance and Sexual Activity  ? Alcohol use: Yes  ?  Alcohol/week: 2.0 - 3.0 standard drinks  ?  Types: 2 - 3 Standard  drinks or equivalent per week  ?  Comment: 1 glass of wine per night/or seltzer  ? Drug use: No  ? Sexual activity: Not Currently  ?  Partners: Male  ?  Birth control/protection: Other-see comments  ?  Comment: husband with vasectomy  ?Other Topics Concern  ? Not on file  ?Social History Narrative  ? Not on file  ? ?Social Determinants of Health  ? ?Financial Resource Strain: Not on file  ?Food Insecurity: Not on file  ?Transportation Needs: Not on file  ?Physical Activity: Not on file  ?Stress: Not on file  ?Social Connections: Not on file  ?Intimate Partner Violence: Not on file  ? ? ? ?Physical Exam: ?BP 122/80   Pulse 92   Resp (!) 98   Ht 5' 7"  (1.702 m)   Wt 229 lb 6.4 oz (104.1 kg)   LMP 10/27/2015 (Exact Date) Comment: hysterectomy  BMI 35.93 kg/m?  ?Constitutional: generally well-appearing ?Psychiatric: alert and oriented x3 ?Eyes: extraocular movements intact ?Mouth: oral pharynx moist, no lesions ?Neck: supple no lymphadenopathy ?Cardiovascular: heart regular rate and rhythm ?Lungs: clear to auscultation bilaterally ?Abdomen: soft, nontender, nondistended, no obvious ascites, no peritoneal signs, normal bowel sounds ?Extremities: no lower extremity edema bilaterally ?Skin: no lesions on visible extremities ? ? ?Assessment and plan: ?46 y.o. female with elevated AST and ALT, obesity ? ?She thinks that her elevated liver tests were from over drinking vodka after her husband died a few months ago.  She might indeed be correct.  I did explain that obesity can cause fatty liver and that could be another cause of her elevated liver numbers.  I suggested we first start with basic set of repeat LFTs.  She understands that if her liver numbers are still elevated then she would need further testing for other potential causes (bloodwork and [place]).  If her liver numbers have improved back to normal then I suspect that it indeed was her over drinking of vodka that caused this problem. ? ? ?Please see the "Patient  Instructions" section for addition details about the plan. ? ? ?Owens Loffler, MD ?[facility] ?06/23/2021, 10:40 AM ? ?Cc: Kathyrn Lass, MD ? ?Total time on date of encounter was 45  minutes (this included time spent preparing to see the patient reviewing records; obtaining and/or reviewing separately obtained history; performing a medically appropriate exam and/or evaluation; counseling and educating the patient and family if present; ordering medications, tests or procedures if applicable; and documenting clinical information in the health record). ? ? ?

## 2021-06-23 NOTE — Patient Instructions (Signed)
If you are age 47 or younger, your body mass index should be between 19-25. Your Body mass index is 35.93 kg/m?Marland Kitchen If this is out of the aformentioned range listed, please consider follow up with your Primary Care Provider.  ?________________________________________________________ ? ?The [facility] providers would like to encourage you to use [facility] to communicate with providers for non-urgent requests or questions.  Due to long hold times on the telephone, sending your provider a message by [facility] may be a faster and more efficient way to get a response.  Please allow 48 business hours for a response.  Please remember that this is for non-urgent requests.  ?_______________________________________________________ ? ?Your provider has requested that you go to the basement level for lab work before leaving today. Press "B" on the elevator. The lab is located at the first door on the left as you exit the elevator. ? ?Due to recent changes in healthcare laws, you may see the results of your imaging and laboratory studies on MyChart before your provider has had a chance to review them.  We understand that in some cases there may be results that are confusing or concerning to you. Not all laboratory results come back in the same time frame and the provider may be waiting for multiple results in order to interpret others.  Please give [place] 48 hours in order for your provider to thoroughly review all the results before contacting the office for clarification of your results.  ? ?Thank you for entrusting me with your care and choosing [facility]. ? ?Dr Ardis Hughs ? ?

## 2021-10-13 ENCOUNTER — Encounter (INDEPENDENT_AMBULATORY_CARE_PROVIDER_SITE_OTHER): Payer: Self-pay

## 2021-11-07 ENCOUNTER — Other Ambulatory Visit: Payer: Self-pay

## 2021-11-07 ENCOUNTER — Emergency Department (HOSPITAL_COMMUNITY)
Admission: EM | Admit: 2021-11-07 | Discharge: 2021-11-07 | Disposition: A | Discharge status: Home / Self Care | Payer: 59 | Attending: Emergency Medicine | Admitting: Emergency Medicine

## 2021-11-07 ENCOUNTER — Encounter (HOSPITAL_COMMUNITY): Payer: Self-pay

## 2021-11-07 DIAGNOSIS — T148XXA Other injury of unspecified body region, initial encounter: Secondary | ICD-10-CM

## 2021-11-07 DIAGNOSIS — Y93G1 Activity, food preparation and clean up: Secondary | ICD-10-CM | POA: Insufficient documentation

## 2021-11-07 DIAGNOSIS — W268XXA Contact with other sharp object(s), not elsewhere classified, initial encounter: Secondary | ICD-10-CM | POA: Insufficient documentation

## 2021-11-07 DIAGNOSIS — Z23 Encounter for immunization: Secondary | ICD-10-CM | POA: Diagnosis not present

## 2021-11-07 DIAGNOSIS — S6991XA Unspecified injury of right wrist, hand and finger(s), initial encounter: Secondary | ICD-10-CM | POA: Diagnosis present

## 2021-11-07 DIAGNOSIS — S61212A Laceration without foreign body of right middle finger without damage to nail, initial encounter: Secondary | ICD-10-CM | POA: Insufficient documentation

## 2021-11-07 MED ORDER — LIDOCAINE-EPINEPHRINE-TETRACAINE (LET) TOPICAL GEL
3.0000 mL | Freq: Once | TOPICAL | Status: AC
  Administered 2021-11-07: 3 mL via TOPICAL
  Filled 2021-11-07: qty 3

## 2021-11-07 MED ORDER — TETANUS-DIPHTH-ACELL PERTUSSIS 5-2.5-18.5 LF-MCG/0.5 IM SUSY
0.5000 mL | PREFILLED_SYRINGE | Freq: Once | INTRAMUSCULAR | Status: AC
  Administered 2021-11-07: 0.5 mL via INTRAMUSCULAR
  Filled 2021-11-07: qty 0.5

## 2021-11-07 NOTE — Discharge Instructions (Signed)
Keep the area clean and dry for the first 24 hours. Do not scratch, rub, or pick at the adhesive. Leave tissue adhesive in place. It will come off naturally after 7-10 days. Do not place tape over the adhesive. The adhesive could come off the wound when you pull the tape off. Protect the wound from further injury until it is healed. Check your wound area every day for signs of infection. Check for: More redness, swelling, or pain,Fluid or blood,Warmth, Pus or a bad smell. Do not take baths, swim, or use a hot tub until your health care provider approves. You may only be allowed to take sponge baths. Ask your health care provider if you may take showers.You can usually shower after the first 24 hours. Cover the dressing with a watertight covering when you take a shower. Do not soak the area where there is tissue adhesive. Do not use any soaps, petroleum jelly products, or ointments on the wound. Certain ointments can weaken the adhesive.

## 2021-11-07 NOTE — ED Provider Notes (Cosign Needed Addendum)
[facility]-EMERGENCY DEPT Provider Note   CSN: [id number] Arrival date & time: 11/07/21  2105     History  Chief Complaint  Patient presents with  . Laceration    Nicole Figueroa is a 46 y.o. female who presents emergency department with chief complaint of right finger injury.  She states that she was washing dishes tonight when something caught her that she was working on.  She believes it was some broken glass.  She has 2 cuts to her the dorsal surface of her right middle finger.  She denies any weakness numbness tingling but has been unable to control bleeding from the distal wound.  She is unsure of her last tetanus vaccination.   Laceration      Home Medications Prior to Admission medications   Medication Sig Start Date End Date Taking? Authorizing Provider  escitalopram (LEXAPRO) 10 MG tablet escitalopram 10 mg tablet  TAKE 1 TABLET BY MOUTH EVERY DAY    [provider]  Eszopiclone 3 MG TABS Take 3 mg by mouth at bedtime. Take immediately before bedtime    [provider]  hydrochlorothiazide (HYDRODIURIL) 25 MG tablet Take 1 tablet by mouth daily. 04/13/15   [provider]      Allergies    Patient has no known allergies.    Review of Systems   Review of Systems  Physical Exam Updated Vital Signs BP (!) 151/99   Pulse (!) 111   Temp 97.9 F (36.6 C) (Oral)   Resp 15   LMP 10/27/2015 (Exact Date) Comment: hysterectomy  SpO2 100%  Physical Exam Vitals and nursing note reviewed.  Constitutional:      General: She is not in acute distress.    Appearance: She is well-developed. She is not diaphoretic.  HENT:     Head: Normocephalic and atraumatic.     Right Ear: External ear normal.     Left Ear: External ear normal.     Nose: Nose normal.     Mouth/Throat:     Mouth: Mucous membranes are moist.  Eyes:     General: No scleral icterus.    Conjunctiva/sclera: Conjunctivae normal.  Cardiovascular:     Rate  and Rhythm: Normal rate and regular rhythm.     Heart sounds: Normal heart sounds. No murmur heard.    No friction rub. No gallop.  Pulmonary:     Effort: Pulmonary effort is normal. No respiratory distress.     Breath sounds: Normal breath sounds.  Abdominal:     General: Bowel sounds are normal. There is no distension.     Palpations: Abdomen is soft. There is no mass.     Tenderness: There is no abdominal tenderness. There is no guarding.  Musculoskeletal:     Cervical back: Normal range of motion.     Comments: Right middle finger with distal tissue avulsion on the dorsal surface, small cut to the dorsal surface just inferior to the PIP full range of motion, bleeding is not controlled at this time  Skin:    General: Skin is warm and dry.  Neurological:     Mental Status: She is alert and oriented to person, place, and time.  Psychiatric:        Behavior: Behavior normal.    ED Results / Procedures / Treatments   Labs (all labs ordered are listed, but only abnormal results are displayed) Labs Reviewed - No data to display  EKG None  Radiology No results found.  Procedures .Marland KitchenLaceration Repair  Date/Time: 11/07/2021 10:51 PM  Performed by: Arthor Captain, PA-C Authorized by: Arthor Captain, PA-C   Consent:    Consent obtained:  Verbal   Consent given by:  Patient   Risks discussed:  Infection, need for additional repair, pain, poor cosmetic result and poor wound healing   Alternatives discussed:  No treatment and delayed treatment Universal protocol:    Procedure explained and questions answered to patient or proxy's satisfaction: yes     Relevant documents present and verified: yes     Test results available: yes     Imaging studies available: yes     Required blood products, implants, devices, and special equipment available: yes     Site/side marked: yes     Immediately prior to procedure, a time out was called: yes     Patient identity confirmed:  Verbally  with patient Anesthesia:    Anesthesia method:  Topical application   Topical anesthetic:  LET Laceration details:    Length (cm):  1.5 Exploration:    Hemostasis achieved with:  Cautery   Wound exploration: wound explored through full range of motion and entire depth of wound visualized   Skin repair:    Repair method:  Tissue adhesive Repair type:    Repair type:  Simple Post-procedure details:    Dressing:  Open (no dressing)   Procedure completion:  Tolerated well, no immediate complications     Medications Ordered in ED Medications  lidocaine-EPINEPHrine-tetracaine (LET) topical gel (3 mLs Topical Given 11/07/21 2150)  Tdap (BOOSTRIX) injection 0.5 mL (0.5 mLs Intramuscular Given 11/07/21 2148)    ED Course/ Medical Decision Making/ A&P                           Medical Decision Making Patient with tissue avulsion. Tdap updated. Hemostasis achieved with chemical cautery.  Dermabond applied.  Tissue adhesive wound cared instructions given. No signs of retained FB, tendon injury or frx. Appropriated fro DC.  Risk Prescription drug management.   Patient with tissue avulsion repaired with tissue adhesive wound repair agent.  Hemostasis achieved with chemical cautery. Tetanus updated, no evidence of deeper structure injury.  Patient will be discharged to follow-up with PCP if needed.  Return precautions discussed.  Final Clinical Impression(s) / ED Diagnoses Final diagnoses:  Soft tissue avulsion    Rx / DC Orders ED Discharge Orders     None         Arthor Captain, PA-C 11/08/21 1246    Rondel Baton, MD 11/11/21 1114    Arthor Captain, PA-C 11/23/21 1603    Rondel Baton, MD 11/25/21 1058

## 2021-11-07 NOTE — ED Triage Notes (Signed)
Patient was washing dishes and cut her right middle finger. Tetanus shot not up to date. Takes a baby aspirin a day.

## 2021-12-06 ENCOUNTER — Telehealth: Payer: Self-pay

## 2021-12-06 DIAGNOSIS — R7989 Other specified abnormal findings of blood chemistry: Secondary | ICD-10-CM

## 2021-12-06 NOTE — Telephone Encounter (Signed)
Dr Henrene Pastor,  This pt needs 6 month LFT's per Ardis Hughs dx elevated liver tests.  You are DOD this afternoon.  I will be ordering these labs under your name.  Thank you, Elmyra Ricks

## 2021-12-06 NOTE — Telephone Encounter (Signed)
-----   Message from Stevan Born, [place] sent at 06/23/2021  2:17 PM EDT ----- Regarding: lab work  Patient advised to recheck LFT's in 6 months (October 2023) per Dr Ardis Hughs, dx elevated liver tests

## 2021-12-06 NOTE — Telephone Encounter (Signed)
Ok. Thanks!
